# Patient Record
Sex: Female | Born: 1950 | Race: White | Hispanic: No | Marital: Single | Smoking: Current every day smoker
Health system: Southern US, Community
[De-identification: ages and names within clinical notes are randomized; demographics above are authoritative.]

## PROBLEM LIST (undated history)

## (undated) DIAGNOSIS — E039 Hypothyroidism, unspecified: Secondary | ICD-10-CM

## (undated) DIAGNOSIS — L80 Vitiligo: Secondary | ICD-10-CM

## (undated) DIAGNOSIS — F418 Other specified anxiety disorders: Secondary | ICD-10-CM

## (undated) DIAGNOSIS — E063 Autoimmune thyroiditis: Secondary | ICD-10-CM

## (undated) DIAGNOSIS — M2140 Flat foot [pes planus] (acquired), unspecified foot: Secondary | ICD-10-CM

## (undated) HISTORY — DX: Vitiligo: L80

## (undated) HISTORY — DX: Autoimmune thyroiditis: E06.3

## (undated) HISTORY — DX: Other specified anxiety disorders: F41.8

## (undated) HISTORY — DX: Flat foot (pes planus) (acquired), unspecified foot: M21.40

## (undated) HISTORY — PX: KNEE ARTHROSCOPY: SUR90

## (undated) HISTORY — DX: Hypothyroidism, unspecified: E03.9

## (undated) HISTORY — PX: BUNIONECTOMY: SHX129

---

## 1976-08-16 HISTORY — PX: TUBAL LIGATION: SHX77

## 1999-02-23 ENCOUNTER — Other Ambulatory Visit: Admission: RE | Admit: 1999-02-23 | Discharge: 1999-02-23 | Payer: Self-pay | Admitting: Obstetrics and Gynecology

## 1999-05-26 ENCOUNTER — Other Ambulatory Visit: Admission: RE | Admit: 1999-05-26 | Discharge: 1999-05-26 | Payer: Self-pay | Admitting: Obstetrics and Gynecology

## 1999-06-17 ENCOUNTER — Encounter (INDEPENDENT_AMBULATORY_CARE_PROVIDER_SITE_OTHER): Payer: Self-pay

## 1999-06-17 ENCOUNTER — Other Ambulatory Visit: Admission: RE | Admit: 1999-06-17 | Discharge: 1999-06-17 | Payer: Self-pay | Admitting: Obstetrics and Gynecology

## 1999-08-05 ENCOUNTER — Encounter: Admission: RE | Admit: 1999-08-05 | Discharge: 1999-08-05 | Payer: Self-pay | Admitting: Obstetrics and Gynecology

## 1999-08-05 ENCOUNTER — Encounter: Payer: Self-pay | Admitting: Obstetrics and Gynecology

## 1999-08-26 ENCOUNTER — Other Ambulatory Visit: Admission: RE | Admit: 1999-08-26 | Discharge: 1999-08-26 | Payer: Self-pay | Admitting: Obstetrics and Gynecology

## 1999-11-23 ENCOUNTER — Other Ambulatory Visit: Admission: RE | Admit: 1999-11-23 | Discharge: 1999-11-23 | Payer: Self-pay | Admitting: Obstetrics and Gynecology

## 2000-01-08 ENCOUNTER — Encounter (INDEPENDENT_AMBULATORY_CARE_PROVIDER_SITE_OTHER): Payer: Self-pay | Admitting: Specialist

## 2000-01-08 ENCOUNTER — Ambulatory Visit (HOSPITAL_COMMUNITY): Admission: RE | Admit: 2000-01-08 | Discharge: 2000-01-08 | Payer: Self-pay | Admitting: Obstetrics and Gynecology

## 2000-02-12 ENCOUNTER — Other Ambulatory Visit: Admission: RE | Admit: 2000-02-12 | Discharge: 2000-02-12 | Payer: Self-pay | Admitting: Obstetrics and Gynecology

## 2000-04-12 ENCOUNTER — Other Ambulatory Visit: Admission: RE | Admit: 2000-04-12 | Discharge: 2000-04-12 | Payer: Self-pay | Admitting: Obstetrics & Gynecology

## 2000-08-12 ENCOUNTER — Encounter: Payer: Self-pay | Admitting: Obstetrics & Gynecology

## 2000-08-12 ENCOUNTER — Encounter: Admission: RE | Admit: 2000-08-12 | Discharge: 2000-08-12 | Payer: Self-pay | Admitting: Obstetrics & Gynecology

## 2000-11-22 ENCOUNTER — Other Ambulatory Visit: Admission: RE | Admit: 2000-11-22 | Discharge: 2000-11-22 | Payer: Self-pay | Admitting: Obstetrics & Gynecology

## 2001-07-18 ENCOUNTER — Encounter: Admission: RE | Admit: 2001-07-18 | Discharge: 2001-07-18 | Payer: Self-pay | Admitting: Obstetrics & Gynecology

## 2001-07-18 ENCOUNTER — Encounter: Payer: Self-pay | Admitting: Obstetrics & Gynecology

## 2002-04-05 ENCOUNTER — Other Ambulatory Visit: Admission: RE | Admit: 2002-04-05 | Discharge: 2002-04-05 | Payer: Self-pay | Admitting: Obstetrics & Gynecology

## 2002-06-08 ENCOUNTER — Encounter (INDEPENDENT_AMBULATORY_CARE_PROVIDER_SITE_OTHER): Payer: Self-pay

## 2002-06-08 ENCOUNTER — Ambulatory Visit (HOSPITAL_COMMUNITY): Admission: RE | Admit: 2002-06-08 | Discharge: 2002-06-08 | Payer: Self-pay | Admitting: Obstetrics & Gynecology

## 2002-08-02 ENCOUNTER — Encounter: Payer: Self-pay | Admitting: Obstetrics & Gynecology

## 2002-08-02 ENCOUNTER — Encounter: Admission: RE | Admit: 2002-08-02 | Discharge: 2002-08-02 | Payer: Self-pay | Admitting: Obstetrics & Gynecology

## 2003-04-17 ENCOUNTER — Other Ambulatory Visit: Admission: RE | Admit: 2003-04-17 | Discharge: 2003-04-17 | Payer: Self-pay | Admitting: Obstetrics & Gynecology

## 2003-08-05 ENCOUNTER — Encounter: Admission: RE | Admit: 2003-08-05 | Discharge: 2003-08-05 | Payer: Self-pay | Admitting: Obstetrics & Gynecology

## 2004-07-21 ENCOUNTER — Other Ambulatory Visit: Admission: RE | Admit: 2004-07-21 | Discharge: 2004-07-21 | Payer: Self-pay | Admitting: Obstetrics & Gynecology

## 2005-10-20 ENCOUNTER — Encounter: Admission: RE | Admit: 2005-10-20 | Discharge: 2005-10-20 | Payer: Self-pay | Admitting: Obstetrics & Gynecology

## 2005-12-01 ENCOUNTER — Encounter: Admission: RE | Admit: 2005-12-01 | Discharge: 2005-12-01 | Payer: Self-pay | Admitting: Obstetrics & Gynecology

## 2005-12-17 ENCOUNTER — Encounter: Admission: RE | Admit: 2005-12-17 | Discharge: 2005-12-17 | Payer: Self-pay | Admitting: Endocrinology

## 2006-11-01 ENCOUNTER — Encounter: Admission: RE | Admit: 2006-11-01 | Discharge: 2006-11-01 | Payer: Self-pay | Admitting: Obstetrics & Gynecology

## 2009-12-22 ENCOUNTER — Encounter: Admission: RE | Admit: 2009-12-22 | Discharge: 2009-12-22 | Payer: Self-pay | Admitting: Obstetrics & Gynecology

## 2010-09-06 ENCOUNTER — Encounter: Payer: Self-pay | Admitting: Endocrinology

## 2010-09-06 ENCOUNTER — Encounter: Payer: Self-pay | Admitting: Internal Medicine

## 2010-09-06 ENCOUNTER — Encounter: Payer: Self-pay | Admitting: Obstetrics & Gynecology

## 2011-01-01 NOTE — Op Note (Signed)
[facility]  Patient:    Wanda Thompson, Wanda Thompson                      MRN: [id number] Proc. Date: 01/08/00 Adm. Date:  [id number] Disc. Date: [id number] Attending:  Malon Kindle                           Operative Report  PREOPERATIVE DIAGNOSES: 1. Abnormal genital cytology, persistent. 2. Unsatisfactory colposcopy. 3. 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  POSTOPERATIVE DIAGNOSES: 1. Abnormal genital cytology, persistent. 2. Unsatisfactory colposcopy. 3. 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  OPERATION:  CO2 laser of the cervix.  OPERATOR:  Malachi Pro. Ambrose Mantle, M.D.  ANESTHESIA:  General anesthesia.  DESCRIPTION OF PROCEDURE:  The patient was brought to the operating room and placed under satisfactory general anesthesia.  The vulva was prepped with Betadine solution.  The vulva was draped with sterile wet towels.  The uterus was anterior, normal size.  The adnexa were free of masses.  The cervix was exposed with a Graves speculum that was ebonized.  The cervix was prepped with 5% acetic acid.  The squamocolumnar junction was not seen.  I outlined the cone with a 10 watt laser, then injected the cervix with a solution mixed with 10 units of Pitressin and 120 cc of saline and I used about 15 cc of this material.  I then used the superpulse to do a conization.  The posterior lip went less far up the cervical canal than the anterior lip.  I cut the cone at 1 oclock after I removed it with the scissors.  I tried to get hemostasis high up on the endocervix with a defocused beam at 10 watts of laser.  I then did a fractional D&C after dilating the cervix just enough to get the small curette in.  I preserved the endocervical and endometrial tissue, then I resutured the cervix with four sutures of 0 chromic catgut.  Sutures were pretty much from 10 to 2, 2 to 5, 5 to 8 and 8 to 10.  Hemostasis appeared adequate and the procedure was  terminated.  Blood loss was less than 10 cc. Patient was returned to recovery in satisfactory condition. DD:  01/08/00 TD:  01/11/00 Job: [id number]

## 2011-01-01 NOTE — Op Note (Signed)
NAME:  Wanda Thompson, Wanda Thompson                         ACCOUNT NO.:  [id number]   MEDICAL RECORD NO.:  [id number]                   PATIENT TYPE:  AMB   LOCATION:  [facility]                                  FACILITY:  [facility]   PHYSICIAN:  Gerrit Friends. Aldona Bar, M.D.                DATE OF BIRTH:  [birth year]   DATE OF PROCEDURE:  06/08/2002  DATE OF DISCHARGE:                                 OPERATIVE REPORT   PREOPERATIVE DIAGNOSES:  Cervical dysplasia, human papilloma virus positive,  previous conization x2 elsewhere.   POSTOPERATIVE DIAGNOSES:  Cervical dysplasia, human papilloma virus  positive, previous conization x2 elsewhere, pathology pending.   PROCEDURE:  Loop electrosurgical excision procedure/conization of cervix.   SURGEON:  Gerrit Friends. Aldona Bar, M.D.   ANESTHESIA:  Intravenous conscious sedation and circumferential cervical  block with 1% Xylocaine with epinephrine.   PROCEDURE:  The patient was taken to the operating room and after the  satisfactory induction of intravenous conscious sedation she was prepped and  draped having been placed in the modified lithotomy position in the short  Allen stirrups in the usual manner for vaginal surgery.  The bladder was  drained of clear urine with a rubber catheter in an in-and-out fashion and  at this time a speculum was placed in the vagina, cervix visualized, and  thereafter infiltrated circumferentially with approximately 15 cc of 1%  Xylocaine with epinephrine.  The internal os was noted to be approximately 1-  1.5 cm within the cervical os.  Using the wide LEEP apparatus, the external  portion of the cervix was removed in the usual fashion with excellent  hemostasis and an excellent specimen.  This was pinned appropriately.  Using  the medium LEEP apparatus the next level was taken and thereafter using the  small LEEP apparatus a third level was taken, thus going all the way to the  internal os.  The cavity was then coagulated with the ball  apparatus and  Monsel solution applied thereafter.  Hemostasis was excellent.  The specimen  was totally pinned out in three portions, level one, level two, and level  three with pins at 12 o'clock.  The specimen was sent in saline to  pathology.   The patient at this time had all instruments removed, was awakened, and  taken to the recovery room in good condition.  She will be given an  instruction sheet and told to return to the office in approximately three  weeks' time.  Condition on arrival to recovery room:  Satisfactory.  Estimated blood loss negligible.  Pathologic specimen:  Conization of cervix  submitted in three levels.  Condition on arrival to recovery room:  Satisfactory.  Gerrit Friends. Aldona Bar, M.D.    RMW/MEDQ  D:  06/08/2002  T:  06/08/2002  Job:  [id number]

## 2011-04-09 ENCOUNTER — Other Ambulatory Visit: Payer: Self-pay | Admitting: Obstetrics & Gynecology

## 2011-04-09 DIAGNOSIS — Z1231 Encounter for screening mammogram for malignant neoplasm of breast: Secondary | ICD-10-CM

## 2011-04-12 ENCOUNTER — Ambulatory Visit
Admission: RE | Admit: 2011-04-12 | Discharge: 2011-04-12 | Disposition: A | Discharge status: Home / Self Care | Payer: 59 | Source: Ambulatory Visit | Attending: Obstetrics & Gynecology | Admitting: Obstetrics & Gynecology

## 2011-04-12 DIAGNOSIS — Z1231 Encounter for screening mammogram for malignant neoplasm of breast: Secondary | ICD-10-CM

## 2011-10-01 ENCOUNTER — Other Ambulatory Visit: Payer: Self-pay | Admitting: Obstetrics & Gynecology

## 2014-10-15 HISTORY — PX: COLONOSCOPY: SHX174

## 2014-12-31 ENCOUNTER — Other Ambulatory Visit: Payer: Self-pay | Admitting: Obstetrics & Gynecology

## 2015-01-01 LAB — CYTOLOGY - PAP

## 2015-03-13 ENCOUNTER — Encounter: Payer: Self-pay | Admitting: Internal Medicine

## 2015-03-13 ENCOUNTER — Institutional Professional Consult (permissible substitution): Payer: Self-pay | Admitting: Internal Medicine

## 2015-03-14 ENCOUNTER — Ambulatory Visit (INDEPENDENT_AMBULATORY_CARE_PROVIDER_SITE_OTHER): Payer: BLUE CROSS/BLUE SHIELD | Admitting: Internal Medicine

## 2015-03-14 ENCOUNTER — Encounter: Payer: Self-pay | Admitting: Internal Medicine

## 2015-03-14 VITALS — BP 116/66 | HR 82 | Ht 61.0 in | Wt 142.4 lb

## 2015-03-14 DIAGNOSIS — F17210 Nicotine dependence, cigarettes, uncomplicated: Secondary | ICD-10-CM

## 2015-03-14 DIAGNOSIS — R05 Cough: Secondary | ICD-10-CM

## 2015-03-14 DIAGNOSIS — Z720 Tobacco use: Secondary | ICD-10-CM | POA: Diagnosis not present

## 2015-03-14 DIAGNOSIS — R053 Chronic cough: Secondary | ICD-10-CM

## 2015-03-14 MED ORDER — ACETAMINOPHEN-CODEINE #3 300-30 MG PO TABS: ORAL_TABLET | ORAL | Status: DC

## 2015-03-14 MED ORDER — PANTOPRAZOLE SODIUM 40 MG PO TBEC: 40.0000 mg | DELAYED_RELEASE_TABLET | Freq: Every day | ORAL | Status: AC

## 2015-03-14 MED ORDER — MOMETASONE FURO-FORMOTEROL FUM 100-5 MCG/ACT IN AERO: INHALATION_SPRAY | RESPIRATORY_TRACT | Status: DC

## 2015-03-14 MED ORDER — PREDNISONE 10 MG PO TABS: ORAL_TABLET | ORAL | Status: DC

## 2015-03-14 MED ORDER — TRAMADOL HCL 50 MG PO TABS: ORAL_TABLET | ORAL | Status: DC

## 2015-03-14 MED ORDER — FAMOTIDINE 20 MG PO TABS: ORAL_TABLET | ORAL | Status: AC

## 2015-03-14 NOTE — Progress Notes (Signed)
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 PULMONARY CONSULT    Referre by Dr Ardeth Perfect for cough. Cough present for several years - worsened over the last 6 months- started MTX Feb 2016. CXR 03/10/15  first problem was bad arthritis since age 75 and Zeminksi dx  RA  rx embel x 1990s then humira > mtx started Feb 2016 then cough much worse  Original cough started around 2000 and the one time cough may have been better s cough suppression was while on advair x 3 month / may have responded to prednisone in past also but not sure.    Cough worse at hs and occ productive first thing in am only , and only a very small amt white mucus even then. Worse at work (though just started new job at [employer] which was long after onset of cough) and when in pool/ when it's rainy , assoc with nasal congestion  No obvious other patterns in day to day or daytime variabilty or assoc sob or cp or chest tightness, subjective wheeze  or hb symptoms. No unusual exp hx or h/o childhood pna/ asthma or knowledge of premature birth.  Sleeping ok without nocturnal  or early am exacerbation  of respiratory  c/o's or need for noct saba. Also denies any obvious fluctuation of symptoms with weather or environmental changes or other aggravating or alleviating factors except as outlined above   Current Medications, Allergies, Complete Past Medical History, Past Surgical History, Family History, and Social History were reviewed in [employer] record.          Review of Systems  Constitutional: Negative for fever and unexpected weight change.  HENT: Positive for congestion.  Negative for dental problem, ear pain, nosebleeds, postnasal drip, rhinorrhea, sinus pressure, sneezing, sore throat and trouble swallowing.   Eyes: Negative for redness and itching.  Respiratory: Positive for cough. Negative for chest tightness, shortness of breath and wheezing.   Cardiovascular: Negative for palpitations and leg swelling.  Gastrointestinal: Negative for nausea and vomiting.  Genitourinary: Negative for dysuria.  Musculoskeletal: Negative for joint swelling.  Skin: Negative for rash.  Neurological: Negative for headaches.  Hematological: Does not bruise/bleed easily.  Psychiatric/Behavioral: Positive for dysphoric mood. The patient is nervous/anxious.        Objective:   Physical Exam  amb wf nad very hoarse   Wt Readings from Last 3 Encounters:  03/14/15 142 lb 6.4 oz (64.592 kg)    Vital signs reviewed   HEENT: nl dentition,  Mod non specific bilateral edema turbinates, and orophanx. Nl external ear canals without cough reflex   NECK :  without JVD/Nodes/TM/ nl carotid upstrokes bilaterally   LUNGS: no acc muscle use, clear to A and P bilaterally without cough on insp or exp maneuvers   CV:  RRR  no s3 or murmur or increase in P2, no edema   ABD:  soft and nontender with nl excursion in the supine position. No bruits or organomegaly, bowel sounds nl  MS:  warm without deformities, calf tenderness, cyanosis or clubbing  SKIN: warm and dry  without lesions    NEURO:  alert, approp, no deficits     cxr 03/12/15  Ok per report      Assessment & Plan:

## 2015-03-14 NOTE — Patient Instructions (Addendum)
Dulera 100 Take 2 puffs first thing in am and then another 2 puffs about 12 hours later.    Pantoprazole (protonix) 40 mg   Take  30-60 min before first meal of the day and Pepcid (famotidine)  20 mg one @  bedtime until return to office - this is the best way to tell whether stomach acid is contributing to your problem.   GERD (REFLUX)  is an extremely common cause of respiratory symptoms just like yours , many times with no obvious heartburn at all.    It can be treated with medication, but also with lifestyle changes including elevation of the head of your bed (ideally with 6 inch  bed blocks),  Smoking cessation, avoidance of late meals, excessive alcohol, and avoid fatty foods, chocolate, peppermint, colas, red wine, and acidic juices such as orange juice.  NO MINT OR MENTHOL PRODUCTS SO NO COUGH DROPS  USE SUGARLESS CANDY INSTEAD (Jolley ranchers or Stover's or Life Savers) or even ice chips will also do - the key is to swallow to prevent all throat clearing. NO OIL BASED VITAMINS - use powdered substitutes.   For drainage take chlortrimeton (chlorpheniramine) 4 mg every 4 hours available over the counter (may cause drowsiness)   Prednisone 10 mg take  4 each am x 2 days,   2 each am x 2 days,  1 each am x 2 days and stop   Take delsym two tsp every 12 hours and supplement if needed with  Tylenol #3 up to 1- 2 every 4 hours to suppress the urge to cough. Swallowing water or using ice chips/non mint and menthol containing candies (such as lifesavers or sugarless jolly ranchers) are also effective.  You should rest your voice and avoid activities that you know make you cough.  Once you have eliminated the cough for 3 straight days try reducing the tylenol #3,  then the delsym as tolerated.     Please schedule a follow up office visit in 2 weeks, sooner if needed with all meds in hand including all inhalers and over the counter meds separated into the automatic vs the as neededs

## 2015-03-15 ENCOUNTER — Encounter: Payer: Self-pay | Admitting: Internal Medicine

## 2015-03-15 DIAGNOSIS — F17210 Nicotine dependence, cigarettes, uncomplicated: Secondary | ICD-10-CM | POA: Insufficient documentation

## 2015-03-15 DIAGNOSIS — J45991 Cough variant asthma: Secondary | ICD-10-CM | POA: Insufficient documentation

## 2015-03-15 NOTE — Assessment & Plan Note (Signed)
We rarely get smokers complaining of chronic cough .  Not that they don't cough/ they just don't complain about it because they know they will be told it's from smoking, which in her case is probably partially true for the am cough that is min productive typical of CB but it can certainly trigger cough variant asthma which may be the case here and she really needs to quit now.  > 3 min discussion  I emphasized that although we never turn away smokers from the pulmonary clinic, we do ask that they understand that the recommendations that we make  won't work nearly as well in the presence of continued cigarette exposure.  In fact, we may very well  reach a point where we can't promise to help the patient if he/she can't quit smoking. (We can and will promise to try to help, we just can't promise what we recommend will really work)

## 2015-03-15 NOTE — Assessment & Plan Note (Signed)
The most common causes of chronic cough in immunocompetent adults include the following: upper airway cough syndrome (UACS), previously referred to as postnasal drip syndrome (PNDS), which is caused by variety of rhinosinus conditions; (2) asthma; (3) GERD; (4) chronic bronchitis from cigarette smoking or other inhaled environmental irritants; (5) nonasthmatic eosinophilic bronchitis; and (6) bronchiectasis.   These conditions, singly or in combination, have accounted for up to 94% of the causes of chronic cough in prospective studies.   Other conditions have constituted no >6% of the causes in prospective studies These have included bronchogenic carcinoma, chronic interstitial pneumonia, sarcoidosis, left ventricular failure, ACEI-induced cough, and aspiration from a condition associated with pharyngeal dysfunction.    Chronic cough is often simultaneously caused by more than one condition. A single cause has been found from 38 to 82% of the time, multiple causes from 18 to 62%. Multiply caused cough has been the result of three diseases up to 42% of the time.       I suspect she has elements of   cough variant asthma  And UACS   with a cyclical component as well   rec trial of dulear 100 2bid  - The proper method of use, as well as anticipated side effects, of a metered-dose inhaler are discussed and demonstrated to the patient. Improved effectiveness after extensive coaching during this visit to a level of approximately  75%  And max gerd/ cyclical cough rx plus 1st gen H1 to eliminate all pnds then go from there   I had an extended discussion with the patient reviewing all relevant studies completed to date and  lasting  35 m  Explained The standardized cough guidelines published in Chest by Lissa Morales in 2006 are still the best available and consist of a multiple step process (up to 12!) , not a single office visit,  and are intended  to address this problem logically,  with an alogrithm  dependent on response to empiric treatment at  each progressive step  to determine a specific diagnosis with  minimal addtional testing needed. Therefore if adherence is an issue or can't be accurately verified,  it's very unlikely the standard evaluation and treatment will be successful here.    Furthermore, response to therapy (other than acute cough suppression, which should only be used short term with avoidance of narcotic containing cough syrups if possible), can be a gradual process for which the patient may perceive immediate benefit.  Unlike going to an eye doctor where the best perscription is almost always the first one and is immediately effective, this is almost never the case in the management of chronic cough syndromes. Therefore the patient needs to commit up front to consistently adhere to recommendations  for up to 6 weeks of therapy directed at the likely underlying problem(s) before the response can be reasonably evaluated.     Each maintenance medication was reviewed in detail including most importantly the difference between maintenance and prns and under what circumstances the prns are to be triggered using an action plan format that is not reflected in the computer generated alphabetically organized AVS.    Please see instructions for details which were reviewed in writing and the patient given a copy highlighting the part that I personally wrote and discussed at today's ov.

## 2015-03-17 ENCOUNTER — Telehealth: Payer: Self-pay | Admitting: Internal Medicine

## 2015-03-17 NOTE — Telephone Encounter (Signed)
Made in error

## 2015-03-28 ENCOUNTER — Ambulatory Visit (INDEPENDENT_AMBULATORY_CARE_PROVIDER_SITE_OTHER): Payer: Commercial Managed Care - HMO | Admitting: Internal Medicine

## 2015-03-28 ENCOUNTER — Encounter: Payer: Self-pay | Admitting: Internal Medicine

## 2015-03-28 VITALS — BP 132/84 | HR 83 | Ht 62.0 in | Wt 142.4 lb

## 2015-03-28 DIAGNOSIS — J45991 Cough variant asthma: Secondary | ICD-10-CM | POA: Diagnosis not present

## 2015-03-28 DIAGNOSIS — Z720 Tobacco use: Secondary | ICD-10-CM

## 2015-03-28 DIAGNOSIS — F17210 Nicotine dependence, cigarettes, uncomplicated: Secondary | ICD-10-CM

## 2015-03-28 MED ORDER — BUDESONIDE-FORMOTEROL FUMARATE 80-4.5 MCG/ACT IN AERO: INHALATION_SPRAY | RESPIRATORY_TRACT | Status: AC

## 2015-03-28 NOTE — Progress Notes (Signed)
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 PULMONARY CONSULT    Referre by Dr Ardeth Perfect for cough. Cough present for several years - worsened over the last 6 months- started MTX Feb 2016. CXR 03/10/15  first problem was bad arthritis since age 23 and Zeminksi dx  RA  rx embel x 1990s then humira > mtx started Feb 2016 then cough much worse  Original cough started around 2000 and the one time cough may have been better s cough suppression was while on advair x 3 month / may have responded to prednisone in past also but not sure.    Cough worse at hs and occ productive first thing in am only , and only a very small amt white mucus even then. Worse at work (though just started new job at [employer] which was long after onset of cough) and when in pool/ when it's rainy , assoc with nasal congestion rec Dulera 100 Take 2 puffs first thing in am and then another 2 puffs about 12 hours later.  Pantoprazole (protonix) 40 mg   Take  30-60 min before first meal of the day and Pepcid (famotidine)  20 mg one @  bedtime until return to office - this is the best way to tell whether stomach acid is contributing to your problem.  GERD diet   For drainage take chlortrimeton (chlorpheniramine) 4 mg every 4 hours available over the counter (may cause drowsiness)  Prednisone 10 mg take  4 each am x 2 days,   2 each am x 2 days,  1 each am x 2 days and stop  Take delsym two tsp every 12 hours and supplement if needed with  Tylenol #3 up to 1- 2 every 4 hours  Once you have eliminated the cough for 3 straight days try reducing the tylenol  #3,  then the delsym as tolerated.    Please schedule a follow up office visit in 2 weeks, sooner if needed with all meds in hand including all inhalers and over the counter meds separated into the automatic vs the as neededs   03/28/2015 f/u ov/Eliasar Hlavaty re: cough x 2000 worse since winter 2016 Chief Complaint  Patient presents with  . Follow-up    Pt states that her cough has pretty much resolved.   resolved on dulera/ no need for saba or cough meds Still has sense of pnds and could not tol 1st gen h1 due to drowsiness  Not limited by breathing from desired activities    No obvious day to day or daytime variability or assoc chronic cough or cp or chest tightness, subjective wheeze or overt sinus or hb symptoms. No unusual exp hx or h/o childhood pna/ asthma or knowledge of premature birth.  Sleeping ok without nocturnal  or early am exacerbation  of respiratory  c/o's or need for noct saba. Also denies any obvious fluctuation of symptoms with weather or environmental changes or other aggravating or alleviating factors except as outlined above   Current Medications, Allergies, Complete Past Medical History, Past Surgical  History, Family History, and Social History were reviewed in [employer] record.  ROS  The following are not active complaints unless bolded sore throat, dysphagia, dental problems, itching, sneezing,  nasal congestion or excess/ purulent secretions, ear ache,   fever, chills, sweats, unintended wt loss, classically pleuritic or exertional cp, hemoptysis,  orthopnea pnd or leg swelling, presyncope, palpitations, abdominal pain, anorexia, nausea, vomiting, diarrhea  or change in bowel or bladder habits, change in stools or urine, dysuria,hematuria,  rash, arthralgias, visual complaints, headache, numbness, weakness or ataxia or problems with walking or coordination,  change in mood/affect or memory.           .       Objective:   Physical Exam  amb wf  nad    Wt Readings from Last 3 Encounters:  03/28/15 142 lb 6.4 oz (64.592 kg)  03/14/15 142 lb 6.4 oz (64.592 kg)    Vital signs reviewed   HEENT: nl dentition,  Mild non specific bilateral edema turbinates, and orophanx. Nl external ear canals without cough reflex   NECK :  without JVD/Nodes/TM/ nl carotid upstrokes bilaterally   LUNGS: no acc muscle use, clear to A and P bilaterally without cough on insp or exp maneuvers   CV:  RRR  no s3 or murmur or increase in P2, no edema   ABD:  soft and nontender with nl excursion in the supine position. No bruits or organomegaly, bowel sounds nl  MS:  warm without deformities, calf tenderness, cyanosis or clubbing  SKIN: warm and dry without lesions    NEURO:  alert, approp, no deficits     cxr 03/12/15  Ok per report      Assessment & Plan:   Outpatient Encounter Prescriptions as of 03/28/2015  Medication Sig  . Adalimumab (HUMIRA PEN) 40 MG/0.8ML PNKT Inject into the skin every 14 (fourteen) days.  . citalopram (CELEXA) 10 MG tablet Take 10 mg by mouth daily.  . famotidine (PEPCID) 20 MG tablet One at bedtime  . folic acid (FOLVITE) 1 MG tablet Take 1 mg by mouth daily.  . Multiple Vitamins-Minerals (MULTIVITAMIN WITH MINERALS) tablet Take 1 tablet by mouth daily.  . pantoprazole (PROTONIX) 40 MG tablet Take 1 tablet (40 mg total) by mouth daily. Take 30-60 min before first meal of the day  . SYNTHROID 100 MCG tablet Take 100 mcg by mouth daily.  . [DISCONTINUED] mometasone-formoterol (DULERA) 100-5 MCG/ACT AERO Take 2 puffs first thing in am and then another 2 puffs about 12 hours later.  Marland Kitchen acetaminophen-codeine (TYLENOL #3) 300-30 MG per tablet One every 4 hours as needed for cough (Patient not taking: Reported on 03/28/2015)  . budesonide-formoterol (SYMBICORT) 80-4.5 MCG/ACT inhaler Take 2 puffs first thing in am and then another 2 puffs about 12 hours later.  . [DISCONTINUED] benzonatate (TESSALON) 100 MG capsule as  needed.  . [DISCONTINUED] Cholecalciferol 1000 UNITS capsule Take 1,000 Units by mouth daily.  . [DISCONTINUED] methotrexate (RHEUMATREX) 2.5 MG tablet Take 3 tablets by mouth once a week.  . [DISCONTINUED] predniSONE (DELTASONE) 10 MG tablet Take  4 each am x 2 days,   2 each am x 2 days,  1 each am x 2 days and stop  . [DISCONTINUED] sertraline (ZOLOFT) 100 MG tablet Take 100 mg by mouth daily.   No facility-administered encounter medications on file as of 03/28/2015.

## 2015-03-28 NOTE — Patient Instructions (Signed)
Finish dulera and then start symbicort 80 Take 2 puffs first thing in am and then another 2 puffs about 12 hours later and call us if your pharmacy doesn't honor the coupon  For itching /sneezing/ runny nose > certrazine 10 mg daily as needed (zyrtec)   The key is to stop smoking completely before smoking completely stops you!   Please schedule a follow up office visit in 6 weeks, call sooner if needed

## 2015-03-29 ENCOUNTER — Encounter: Payer: Self-pay | Admitting: Internal Medicine

## 2015-03-29 NOTE — Assessment & Plan Note (Signed)
>   3 min discussion I reviewed the Fletcher curve with the patient that basically indicates  if you quit smoking when your best day FEV1 is still well preserved (as appears to be  the case here)  it is highly unlikely you will progress to severe disease and informed the patient there was no medication on the market that has proven to alter the curve/ its downward trajectory  or the likelihood of progression of their disease.  Therefore stopping smoking and maintaining abstinence is the most important aspect of care, not choice of inhalers or for that matter, doctors.

## 2015-03-29 NOTE — Assessment & Plan Note (Addendum)
Very impressive response to empiric rx for cough variant despite ongoing smoking (see sep a/p)  All goals of chronic asthma control met including optimal function and elimination of symptoms with noneed for rescue therapy.  Contingencies discussed in full including contacting this office immediately if not controlling the symptoms using the rule of two's.     I had an extended discussion with the patient reviewing all relevant studies completed to date and  lasting 15 to 20 minutes of a 25 minute visit    Each maintenance medication was reviewed in detail including most importantly the difference between maintenance and prns and under what circumstances the prns are to be triggered using an action plan format that is not reflected in the computer generated alphabetically organized AVS.    Please see instructions for details which were reviewed in writing and the patient given a copy highlighting the part that I personally wrote and discussed at today's ov.   If continues to do so well can wean off the gerd rx p 6 weeks/ advised

## 2015-04-02 ENCOUNTER — Telehealth: Payer: Self-pay | Admitting: *Deleted

## 2015-04-02 NOTE — Telephone Encounter (Signed)
-----   Message from Tanda Rockers, MD sent at 03/29/2015  8:12 PM EDT ----- Be sure we gave her the zero coupon for symbicort

## 2015-04-02 NOTE — Telephone Encounter (Signed)
lmtcb x1 

## 2015-04-03 NOTE — Telephone Encounter (Signed)
lmtcb x1 

## 2015-04-03 NOTE — Telephone Encounter (Signed)
She did receive zero coupon for Symbicort and was able to use it.  She is asking when will she know the Wanda Thompson is out?  Can leave a message for her at [phone].

## 2015-04-03 NOTE — Telephone Encounter (Signed)
lmomtcb x 2  

## 2015-04-11 NOTE — Telephone Encounter (Signed)
Spoke with the pt  She is aware Ruthe Mannan has a counter on it and has no further questions about meds  Nothing further needed

## 2015-05-12 ENCOUNTER — Ambulatory Visit: Payer: Self-pay | Admitting: Internal Medicine

## 2015-05-26 ENCOUNTER — Ambulatory Visit (INDEPENDENT_AMBULATORY_CARE_PROVIDER_SITE_OTHER): Payer: Commercial Managed Care - HMO | Admitting: Internal Medicine

## 2015-05-26 ENCOUNTER — Encounter: Payer: Self-pay | Admitting: Internal Medicine

## 2015-05-26 VITALS — BP 124/74 | HR 86 | Ht 61.0 in | Wt 141.6 lb

## 2015-05-26 DIAGNOSIS — J45991 Cough variant asthma: Secondary | ICD-10-CM | POA: Diagnosis not present

## 2015-05-26 DIAGNOSIS — Z720 Tobacco use: Secondary | ICD-10-CM | POA: Diagnosis not present

## 2015-05-26 DIAGNOSIS — F17210 Nicotine dependence, cigarettes, uncomplicated: Secondary | ICD-10-CM

## 2015-05-26 NOTE — Progress Notes (Signed)
Subjective:    Patient ID: Wanda Thompson, female    DOB: [birth year]      MRN: [id number]   Brief patient profile:  64 yowf former  insurance biller for [employer] active smoker with cough onset x around 2000 much worse since winter 20016 referred to pulmonary clinic 03/14/15  by Dr Ardeth Perfect   History of Present Illness  03/14/2015 1st [facility] office visit/ [facility]   Chief Complaint  Patient presents with  . PULMONARY CONSULT    Referre by Dr Ardeth Perfect for cough. Cough present for several years - worsened over the last 6 months- started MTX Feb 2016. CXR 03/10/15  first problem was bad arthritis since age 64 and Zeminksi dx  RA  rx embel x 1990s then humira > mtx started Feb 2016 then cough much worse  Original cough started around 2000 and the one time cough may have been better s cough suppression was while on advair x 3 month / may have responded to prednisone in past also but not sure.    Cough worse at hs and occ productive first thing in am only , and only a very small amt white mucus even then. Worse at work (though just started new job at [employer] which was long after onset of cough) and when in pool/ when it's rainy , assoc with nasal congestion rec Dulera 100 Take 2 puffs first thing in am and then another 2 puffs about 12 hours later.  Pantoprazole (protonix) 40 mg   Take  30-60 min before first meal of the day and Pepcid (famotidine)  20 mg one @  bedtime until return to office - this is the best way to tell whether stomach acid is contributing to your problem.  GERD diet   For drainage take chlortrimeton (chlorpheniramine) 4 mg every 4 hours available over the counter (may cause drowsiness)  Prednisone 10 mg take  4 each am x 2 days,   2 each am x 2 days,  1 each am x 2 days and stop  Take delsym two tsp every 12 hours and supplement if needed with  Tylenol #3 up to 1- 2 every 4 hours  Once you have eliminated the cough for 3 straight days try reducing the tylenol  #3,  then the delsym as tolerated.    Please schedule a follow up office visit in 2 weeks, sooner if needed with all meds in hand including all inhalers and over the counter meds separated into the automatic vs the as neededs   03/28/2015 f/u ov/Wert re: cough x 2000 worse since winter 2016 Chief Complaint  Patient presents with  . Follow-up    Pt states that her cough has pretty much resolved.   resolved on dulera/ no need for saba or cough meds Still has sense of pnds and could not tol 1st gen h1 due to drowsiness rec [employer] and then start symbicort 80 Take 2 puffs first thing in am and then another 2 puffs about 12 hours later and call us if your pharmacy doesn't honor the coupon For itching /sneezing/ runny nose > certrazine 10 mg daily as needed (zyrtec)  The key is to stop smoking completely before smoking completely stops you!     05/26/2015  f/u ov/Wert re: chronic cough worse since winter 2016/ still smoking Chief Complaint  Patient presents with  . Follow-up    Cough bothers her only when it is raining.  Discuss Protonix, making her feel  sick, rumbling stomach. knot on left leg      ppi gi upset so not taking regularly    Not limited by breathing from desired activities / not needs sama or cough suppression regularly   No obvious day to day or daytime variability or assoc chronic cough or cp or chest tightness, subjective wheeze or overt sinus or hb symptoms. No unusual exp hx or h/o childhood pna/ asthma or knowledge of premature birth.  Sleeping ok without nocturnal  or early am exacerbation  of respiratory  c/o's or need for noct saba. Also denies any obvious fluctuation of symptoms with weather or environmental changes or other aggravating or alleviating factors except as outlined above   Current Medications, Allergies, Complete Past Medical History, Past Surgical History, Family History, and Social History were reviewed in [employer]  record.  ROS  The following are not active complaints unless bolded sore throat, dysphagia, dental problems, itching, sneezing,  nasal congestion or excess/ purulent secretions, ear ache,   fever, chills, sweats, unintended wt loss, classically pleuritic or exertional cp, hemoptysis,  orthopnea pnd or leg swelling, presyncope, palpitations, abdominal pain, anorexia, nausea, vomiting, diarrhea  or change in bowel or bladder habits, change in stools or urine, dysuria,hematuria,  rash, arthralgias, visual complaints, headache, numbness, weakness or ataxia or problems with walking or coordination,  change in mood/affect or memory.           .       Objective:   Physical Exam  amb wf nad     .05/26/2015     141  Wt Readings from Last 3 Encounters:  03/28/15 142 lb 6.4 oz (64.592 kg)  03/14/15 142 lb 6.4 oz (64.592 kg)    Vital signs reviewed   HEENT: nl dentition,  Mild non specific bilateral edema turbinates, and orophanx. Nl external ear canals without cough reflex   NECK :  without JVD/Nodes/TM/ nl carotid upstrokes bilaterally   LUNGS: no acc muscle use, clear to A and P bilaterally without cough on insp or exp maneuvers   CV:  RRR  no s3 or murmur or increase in P2, no edema   ABD:  soft and nontender with nl excursion in the supine position. No bruits or organomegaly, bowel sounds nl  MS:  warm without deformities, calf tenderness, cyanosis or clubbing  SKIN: warm and dry without lesions    NEURO:  alert, approp, no deficits     cxr 03/12/15  Ok per report      Assessment & Plan:

## 2015-05-26 NOTE — Patient Instructions (Addendum)
Stop pantoprazole and take pepcid 20 mg after bfast x 2 week then try weaning it off completely and see if you can wean it off   Continue symbicort 80 Take 2 puffs first thing in am and then another 2 puffs about 12 hours later.   Work on inhaler technique:  relax and gently blow all the way out then take a nice smooth deep breath back in, triggering the inhaler at same time you start breathing in.  Hold for up to 5 seconds if you can. Blow out thru nose. Rinse and gargle with water when done - best to brush teeth after using arm and hammer baking soda based    Please schedule a follow up visit in 3 months but call sooner if needed with pfts on return

## 2015-05-27 NOTE — Assessment & Plan Note (Signed)
>   3 m I took an extended  opportunity with this patient to outline the consequences of continued cigarette use  in airway disorders based on all the data we have from the multiple national lung health studies (perfomed over decades at millions of dollars in cost)  indicating that smoking cessation, not choice of inhalers or physicians, is the most important aspect of care.    Needs pft's to see where she is on the fletcher curve - will do on return

## 2015-05-27 NOTE — Assessment & Plan Note (Addendum)
-   trial of dulera 100 2bid 03/14/2015 > changed to symbicort 80 2bid due to cost  Clearly she is much  better at this point despite continued smoking (see sep a/p)   She has clear lungs on exam and no overt evidence of reflux contributing to her cough at this point. I therefore recommended she continue the diet we gave her previously but try weaning off all acid suppression to see if the cough flares "even when it's not raining", and if so that would indicate there is still a component of reflux  I had an extended discussion with the patient reviewing all relevant studies completed to date and  lasting 15 to 20 minutes of a 25 minute visit    Each maintenance medication was reviewed in detail including most importantly the difference between maintenance and prns and under what circumstances the prns are to be triggered using an action plan format that is not reflected in the computer generated alphabetically organized AVS.    Please see instructions for details which were reviewed in writing and the patient given a copy highlighting the part that I personally wrote and discussed at today's ov.

## 2015-06-13 ENCOUNTER — Telehealth: Payer: Self-pay | Admitting: Internal Medicine

## 2015-06-13 NOTE — Telephone Encounter (Signed)
Spoke with pt.  She is aware we will call back to follow up on Monday.  Pt ok with this.

## 2015-06-13 NOTE — Telephone Encounter (Signed)
Spoke with pt, states she requesting to speak with manager/director regarding an "issue" she is following up on from earlier in the week.  Did not wish do further discuss this with me. Forwarding to [employer] please advise.  Thanks!

## 2015-06-17 NOTE — Telephone Encounter (Signed)
Message has been handled.  Will sign off.

## 2015-06-17 NOTE — Telephone Encounter (Signed)
Please advise Crystal, thanks

## 2015-07-09 ENCOUNTER — Ambulatory Visit (INDEPENDENT_AMBULATORY_CARE_PROVIDER_SITE_OTHER): Payer: Commercial Managed Care - HMO | Admitting: Podiatry

## 2015-07-09 ENCOUNTER — Encounter: Payer: Self-pay | Admitting: Podiatry

## 2015-07-09 VITALS — BP 155/83 | HR 76 | Resp 16

## 2015-07-09 DIAGNOSIS — Q828 Other specified congenital malformations of skin: Secondary | ICD-10-CM | POA: Diagnosis not present

## 2015-07-09 DIAGNOSIS — M2041 Other hammer toe(s) (acquired), right foot: Secondary | ICD-10-CM

## 2015-07-09 NOTE — Progress Notes (Signed)
   Subjective:    Patient ID: Wanda Thompson, female    DOB: 01-30-1951, 64 y.o.   MRN: [id number]  HPI Comments: "I have a callus"  Patient presents with: Callouses: Trim calluses plantar left foot - tender for several months, usually just comes to have thme trimmed and feels better. Would like them trimmed today.       Review of Systems  Respiratory: Positive for cough.   All other systems reviewed and are negative.      Objective:   Physical Exam        Assessment & Plan:

## 2015-07-10 NOTE — Progress Notes (Signed)
Subjective:     Patient ID: Wanda Thompson, female   DOB: 04-18-1951, 64 y.o.   MRN: [id number]  HPI patient presents with significant lesion formation plantar aspect of the left foot that's been very painful and she cannot manage herself. She has had a history of this and it's flared up recently and is quite sore and also complains of digital deformity on the right foot that someday needs to be corrected   Review of Systems  All other systems reviewed and are negative.      Objective:   Physical Exam  Constitutional: She is oriented to person, place, and time.  Cardiovascular: Intact distal pulses.   Musculoskeletal: Normal range of motion.  Neurological: She is oriented to person, place, and time.  Skin: Skin is warm.  Nursing note and vitals reviewed.  neurovascular status intact muscle strength adequate range of motion within normal limits with patient noted to have quite a bit of discomfort with keratotic lesion with lucent core plantar aspect left foot. 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

## 2015-07-30 ENCOUNTER — Ambulatory Visit: Payer: Self-pay | Admitting: Podiatry

## 2015-08-28 ENCOUNTER — Ambulatory Visit: Payer: Commercial Managed Care - HMO | Admitting: Internal Medicine

## 2015-09-17 ENCOUNTER — Ambulatory Visit: Payer: Commercial Managed Care - HMO | Admitting: Internal Medicine

## 2015-10-31 ENCOUNTER — Ambulatory Visit: Payer: Commercial Managed Care - HMO | Admitting: Internal Medicine

## 2015-11-03 ENCOUNTER — Ambulatory Visit (INDEPENDENT_AMBULATORY_CARE_PROVIDER_SITE_OTHER): Payer: 59 | Admitting: Podiatry

## 2015-11-03 ENCOUNTER — Encounter: Payer: Self-pay | Admitting: Podiatry

## 2015-11-03 VITALS — BP 128/71 | HR 75 | Resp 16

## 2015-11-03 DIAGNOSIS — Q828 Other specified congenital malformations of skin: Secondary | ICD-10-CM | POA: Diagnosis not present

## 2015-11-03 DIAGNOSIS — M2041 Other hammer toe(s) (acquired), right foot: Secondary | ICD-10-CM

## 2015-11-03 DIAGNOSIS — M216X9 Other acquired deformities of unspecified foot: Secondary | ICD-10-CM

## 2015-11-04 NOTE — Progress Notes (Signed)
Subjective:     Patient ID: Wanda Thompson, female   DOB: 1950-10-12, 65 y.o.   MRN: [id number]  HPI patient presents with very painful lesion plantar aspect left foot that she has utilized debridement which gave her temporary relief of symptoms   Review of Systems     Objective:   Physical Exam Neurovascular status intact with continued keratotic lesion sub-third metatarsal left that's very painful when pressed with lesion directly on the metatarsal head    Assessment:     Combination of structural deformity with chronic skin lesion plantar left    Plan:     Educated patient on consideration of orthotics or possible surgical intervention in this case. Today deep debridement accomplished with padding and will be seen back as needed

## 2016-04-12 ENCOUNTER — Other Ambulatory Visit: Payer: Self-pay | Admitting: Internal Medicine

## 2016-06-01 ENCOUNTER — Other Ambulatory Visit: Payer: Self-pay | Admitting: Obstetrics & Gynecology

## 2016-10-28 ENCOUNTER — Telehealth: Payer: Self-pay | Admitting: Hematology

## 2016-10-28 ENCOUNTER — Encounter: Payer: Self-pay | Admitting: Hematology

## 2016-10-28 NOTE — Telephone Encounter (Signed)
Appt has been scheduled with Dr. Irene Limbo on 4/17 at 1pm. Scheduled with the referring office who will contact the pt about the appt. Letter mailed.

## 2016-11-30 ENCOUNTER — Ambulatory Visit (HOSPITAL_BASED_OUTPATIENT_CLINIC_OR_DEPARTMENT_OTHER): Payer: 59

## 2016-11-30 ENCOUNTER — Ambulatory Visit (HOSPITAL_BASED_OUTPATIENT_CLINIC_OR_DEPARTMENT_OTHER): Payer: 59 | Admitting: Hematology

## 2016-11-30 ENCOUNTER — Encounter: Payer: Self-pay | Admitting: Hematology

## 2016-11-30 ENCOUNTER — Telehealth: Payer: Self-pay | Admitting: Hematology

## 2016-11-30 VITALS — BP 134/67 | HR 92 | Temp 98.2°F | Resp 17 | Ht 61.0 in | Wt 137.1 lb

## 2016-11-30 DIAGNOSIS — R21 Rash and other nonspecific skin eruption: Secondary | ICD-10-CM

## 2016-11-30 DIAGNOSIS — D72829 Elevated white blood cell count, unspecified: Secondary | ICD-10-CM | POA: Diagnosis not present

## 2016-11-30 DIAGNOSIS — Z720 Tobacco use: Secondary | ICD-10-CM | POA: Diagnosis not present

## 2016-11-30 DIAGNOSIS — M069 Rheumatoid arthritis, unspecified: Secondary | ICD-10-CM | POA: Diagnosis not present

## 2016-11-30 LAB — CBC & DIFF AND RETIC
BASO%: 0.5 % (ref 0.0–2.0)
BASOS ABS: 0.1 10*3/uL (ref 0.0–0.1)
EOS%: 1.5 % (ref 0.0–7.0)
Eosinophils Absolute: 0.2 10*3/uL (ref 0.0–0.5)
HEMATOCRIT: 41.1 % (ref 34.8–46.6)
HGB: 14 g/dL (ref 11.6–15.9)
Immature Retic Fract: 3.5 % (ref 1.60–10.00)
LYMPH#: 3.2 10*3/uL (ref 0.9–3.3)
LYMPH%: 23 % (ref 14.0–49.7)
MCH: 29.9 pg (ref 25.1–34.0)
MCHC: 34.1 g/dL (ref 31.5–36.0)
MCV: 87.6 fL (ref 79.5–101.0)
MONO#: 1.1 10*3/uL — ABNORMAL HIGH (ref 0.1–0.9)
MONO%: 8 % (ref 0.0–14.0)
NEUT%: 67 % (ref 38.4–76.8)
NEUTROS ABS: 9.3 10*3/uL — AB (ref 1.5–6.5)
Platelets: 193 10*3/uL (ref 145–400)
RBC: 4.69 10*6/uL (ref 3.70–5.45)
RDW: 14.2 % (ref 11.2–14.5)
RETIC %: 1.45 % (ref 0.70–2.10)
RETIC CT ABS: 68.01 10*3/uL (ref 33.70–90.70)
WBC: 13.9 10*3/uL — AB (ref 3.9–10.3)

## 2016-11-30 LAB — COMPREHENSIVE METABOLIC PANEL
ALT: 18 U/L (ref 0–55)
AST: 19 U/L (ref 5–34)
Albumin: 4.1 g/dL (ref 3.5–5.0)
Alkaline Phosphatase: 96 U/L (ref 40–150)
Anion Gap: 12 mEq/L — ABNORMAL HIGH (ref 3–11)
BUN: 12.7 mg/dL (ref 7.0–26.0)
CALCIUM: 9.5 mg/dL (ref 8.4–10.4)
CHLORIDE: 106 meq/L (ref 98–109)
CO2: 24 meq/L (ref 22–29)
CREATININE: 0.7 mg/dL (ref 0.6–1.1)
EGFR: >90 mL/min/{1.73_m2} (ref >90)
GLUCOSE: 88 mg/dL (ref 70–140)
POTASSIUM: 3.9 meq/L (ref 3.5–5.1)
SODIUM: 142 meq/L (ref 136–145)
Total Bilirubin: 0.55 mg/dL (ref 0.20–1.20)
Total Protein: 7.9 g/dL (ref 6.4–8.3)

## 2016-11-30 LAB — LACTATE DEHYDROGENASE: LDH: 216 U/L (ref 125–245)

## 2016-11-30 LAB — CHCC SMEAR

## 2016-11-30 NOTE — Telephone Encounter (Signed)
Lab appointment added for today, per 11/30/16 los. Follow up as needed, per Dr Irene Limbo.

## 2016-11-30 NOTE — Progress Notes (Signed)
[facility]    HEMATOLOGY/ONCOLOGY CONSULTATION NOTE  Date of Service: 11/30/2016  Patient Care Team: Velna Hatchet, MD as PCP - General (Internal Medicine) Hermelinda Medicus MD (Rheumatologist) -[facility] Also follows with [facility] and dermatology   CHIEF COMPLAINTS/PURPOSE OF CONSULTATION:  Leucocytosis  HISTORY OF PRESENTING ILLNESS:  Wanda Thompson is a wonderful 66 y.o. female who has been referred to [place] by Dr .Velna Hatchet, MD  for evaluation and management of leucocytosis.  Patient has a history of rheumatoid arthritis and notes that she has been on Humira for 21 years and was previously on enbrel, Hashimoto's thyroiditis, vitiligo, active smoker one pack per day, depression/anxiety : Routine labs with her primary care physician on 10/12/2016 was noted to have mild leukocytosis of 12.1k with a normal hemoglobin of 14.9 and a normal platelet count of 280k. Patient had repeat labs on 10/20/2016 which showed a WBC count of 13.56k with neutrophils of 9.4k, monocytes 800. Lymphocytes 2.9k. With normal hemoglobin and platelets.  Patient was referred to [place] for further evaluation of her neutrophilic leukocytosis. She notes no acute fevers or chills.  Notes that she has been having red nodule or bumps on her skin over the upper and lower extremities, the armpits and some on the trunk as well as a rash in her belly button. She notes that her rheumatologist treated her with fluconazole and her rash got better but still comes back from time to time.  She continues to smoke 1 pack of cigarettes per day.  Notes multiple symptoms including a chronic cough and notes that she has had some infiltrates in the lung. CT images are not available to [place]. She also notes that she has a lung nodule that is being monitored by her pulmonologist. She knows that there was some concern that she might be having interstitial lung disease related to her rheumatoid arthritis but is unsure about  this.  She is also concerned that she is developing significant nail changes at the base of her nail. She notes that she has been seen by dermatology and nail testing did not show overt fungal infection but that she did not follow up appropriately to complete the workup and wonders if she should go back to dermatology.   We discussed that the nail changes could certainly be from a persistent fungal infection in the setting of immunosuppression or could be other atypical infections such as nocardia or cryptococcus but can also be autoimmune nail bed destruction due to her underlying autoimmune conditions.  No abdominal pain. No issues with bleeding.   MEDICAL HISTORY:  Past Medical History:  Diagnosis Date  . Collapsed arches    left  . Depression with anxiety   . Hashimoto's disease   . Hypothyroid   . Vitiligo   Rheumatoid arthritis on Humira for 66 y/o per patient (was previously on enbrel).   SURGICAL HISTORY: Past Surgical History:  Procedure Laterality Date  . BUNIONECTOMY  20 years ago  . COLONOSCOPY  10/2014  . KNEE ARTHROSCOPY  15 years ago  . TUBAL LIGATION  1978    SOCIAL HISTORY: Social History   Social History  . Marital status: Single    Spouse name: N/A  . Number of children: N/A  . Years of education: N/A   Occupational History  . insurance billing    Social History Main Topics  . Smoking status: Current Every Day Smoker    Packs/day: 0.50    Types: Cigarettes  . Smokeless tobacco: Never Used  .  Alcohol use 0.0 oz/week     Comment: 3-4 glasses wine weekly  . Drug use: No  . Sexual activity: Not on file   Other Topics Concern  . Not on file   Social History Narrative  . 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  FAMILY HISTORY: History reviewed. No pertinent family history.  ALLERGIES:  has No Known Allergies.  MEDICATIONS:  Current Outpatient Prescriptions  Medication Sig Dispense Refill  . Adalimumab (HUMIRA PEN) 40  MG/0.8ML PNKT Inject into the skin every 14 (fourteen) days.    . budesonide-formoterol (SYMBICORT) 80-4.5 MCG/ACT inhaler Take 2 puffs first thing in am and then another 2 puffs about 12 hours later. 1 Inhaler 11  . citalopram (CELEXA) 10 MG tablet Take 10 mg by mouth daily.  1  . famotidine (PEPCID) 20 MG tablet One at bedtime 30 tablet 11  . folic acid (FOLVITE) 1 MG tablet Take 1 mg by mouth daily.    . Multiple Vitamins-Minerals (MULTIVITAMIN WITH MINERALS) tablet Take 1 tablet by mouth daily.    . pantoprazole (PROTONIX) 40 MG tablet Take 1 tablet (40 mg total) by mouth daily. Take 30-60 min before first meal of the day 30 tablet 2  . SYNTHROID 100 MCG tablet Take 100 mcg by mouth daily.     No current facility-administered medications for this visit.     REVIEW OF SYSTEMS:    10 Point review of Systems was done is negative except as noted above.  PHYSICAL EXAMINATION: ECOG PERFORMANCE STATUS: 2 - Symptomatic, <50% confined to bed  . Vitals:   11/30/16 1318  BP: 134/67  Pulse: 92  Resp: 17  Temp: 98.2 F (36.8 C)   Filed Weights   11/30/16 1318  Weight: 137 lb 1.6 oz (62.2 kg)   .Body mass index is 25.9 kg/m.  GENERAL:alert, in no acute distress and comfortable SKIN - few red Skin spots on her forearms. Significant appearance of base of the nail dystrophic changes on most fingers. Red scaly rash in the belly button. EYES: conjunctiva are pink and non-injected, sclera anicteric OROPHARYNX: MMM, no exudates, no oropharyngeal erythema or ulceration NECK: supple, no JVD LYMPH:  no palpable lymphadenopathy in the cervical, axillary or inguinal regions LUNGS: clear to auscultation b/l with normal respiratory effort HEART: regular rate & rhythm ABDOMEN:  normoactive bowel sounds , non tender, not distended. No palpable hepato-splenomegaly Extremity: no pedal edema PSYCH: alert & oriented x 3 with fluent speech NEURO: no focal motor/sensory deficits  LABORATORY DATA:  I  have reviewed the data as listed  . CBC Latest Ref Rng & Units 11/30/2016  WBC 3.9 - 10.3 10e3/uL 13.9(H)  Hemoglobin 11.6 - 15.9 g/dL 14.0  Hematocrit 34.8 - 46.6 % 41.1  Platelets 145 - 400 10e3/uL 193   . CBC    Component Value Date/Time   WBC 13.9 (H) 11/30/2016 1434   RBC 4.69 11/30/2016 1434   HGB 14.0 11/30/2016 1434   HCT 41.1 11/30/2016 1434   PLT 193 11/30/2016 1434   MCV 87.6 11/30/2016 1434   MCH 29.9 11/30/2016 1434   MCHC 34.1 11/30/2016 1434   RDW 14.2 11/30/2016 1434   LYMPHSABS 3.2 11/30/2016 1434   MONOABS 1.1 (H) 11/30/2016 1434   EOSABS 0.2 11/30/2016 1434   BASOSABS 0.1 11/30/2016 1434    . CMP Latest Ref Rng & Units 11/30/2016  Glucose 70 - 140 mg/dl 88  BUN 7.0 - 26.0 mg/dL 12.7  Creatinine 0.6 -  1.1 mg/dL 0.7  Sodium 136 - 145 mEq/L 142  Potassium 3.5 - 5.1 mEq/L 3.9  CO2 22 - 29 mEq/L 24  Calcium 8.4 - 10.4 mg/dL 9.5  Total Protein 6.4 - 8.3 g/dL 7.9  Total Bilirubin 0.20 - 1.20 mg/dL 0.55  Alkaline Phos 40 - 150 U/L 96  AST 5 - 34 U/L 19  ALT 0 - 55 U/L 18     RADIOGRAPHIC STUDIES: I have personally reviewed the radiological images as listed and agreed with the findings in the report. No results found.  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 Patient notes that she remembers having elevated WBC even prior to this. Her leukocytosis is primarily neutrophilia with borderline monocytosis.  Peripheral blood smear does not show any increased blasts or abnormal myeloid maturation. No unaccompanied thrombocytosis or polycythemia. No overt hepatosplenomegaly. LDH is within normal limits with no evidence of lymphadenopathy or hepatosplenomegaly.  Denies recent due to steroids.  Overall picture does more consistent with a reactive process - from her chronic inflammatory disorder that his rheumatoid arthritis, skin infection, nail inflammation/infection,  active significant smoking, possible inflammatory lung disorder. Plan -Patient is immunosuppressed due to her humira for RA, which she has been on for 21 years as per her report. This is known to suppress cell-mediated immunity and can increase risk of fungal infections, PJP, Cryptococcus and other atypical infections. -Since she has noted no skin lesions some respiratory symptoms nail changes and a skin rash that was responsive to fluconazole the differentials would include a fungal infection or Cryptococcus. -Might need consideration for an infectious disease evaluation. -We will send out Cryptococcus serum antigen test. -Could also be at risk for disseminated fungal infections. Further management and evaluation per primary care physician and rheumatology. -Peripheral blood smear and clinical presentation not overtly concerning for myeloproliferative neoplasm. -will get BCR-ABL test to r/o clonal leucocytosis -Counseled patient on smoking cessation. -Optimal control of rheumatoid arthritis. Will check a sedimentation rate and CRP to check for markers of systemic inflammation. -Continue follow-up with pulmonology for monitoring and management of lung nodule and possible interstitial lung disease. -Would hold off on any bone marrow examinations at this time unless there is a significant change in her blood counts. -Patient is disinclined to continue follow-up with [place] since she has " so many doctors" -Would recommend repeat CBC with differential with her primary care physician in 3-4 months. -If there is significant increase in leukocytosis or significant CBC abnormalities kindly reconsult [place].  Labs today RTC with Dr Irene Limbo on an as needed basis  All of the patients questions were answered with apparent satisfaction. The patient knows to call the clinic with any problems, questions or concerns.  I spent 40 minutes counseling the patient face to face. The total time spent in the appointment was 55  minutes and more than 50% was on counseling and direct patient cares.    Sullivan Lone MD [place] AAHIVMS [facility] Hematology/Oncology Physician [facility]  (Office):       [phone] (Work cell):  [phone] (Fax):           [phone]  11/30/2016 1:50 PM

## 2016-12-01 LAB — SEDIMENTATION RATE: Sedimentation Rate-Westergren: 30 mm/hr (ref 0–40)

## 2016-12-01 LAB — C-REACTIVE PROTEIN: CRP: 4.5 mg/L (ref 0.0–4.9)

## 2016-12-02 LAB — CRYPTOCOCCAL ANTIGEN: Cryptococcus Antigen, Serum: NEGATIVE

## 2017-08-03 ENCOUNTER — Other Ambulatory Visit: Payer: Self-pay | Admitting: Internal Medicine

## 2017-08-03 DIAGNOSIS — M5136 Other intervertebral disc degeneration, lumbar region: Secondary | ICD-10-CM

## 2017-08-30 ENCOUNTER — Other Ambulatory Visit: Payer: Self-pay | Admitting: Internal Medicine

## 2017-08-30 DIAGNOSIS — M5136 Other intervertebral disc degeneration, lumbar region: Secondary | ICD-10-CM

## 2017-08-30 DIAGNOSIS — M5430 Sciatica, unspecified side: Secondary | ICD-10-CM

## 2017-09-02 ENCOUNTER — Ambulatory Visit
Admission: RE | Admit: 2017-09-02 | Discharge: 2017-09-02 | Disposition: A | Discharge status: Home / Self Care | Payer: 59 | Source: Ambulatory Visit | Attending: Internal Medicine | Admitting: Internal Medicine

## 2017-09-02 DIAGNOSIS — M5430 Sciatica, unspecified side: Secondary | ICD-10-CM

## 2017-09-02 DIAGNOSIS — M5136 Other intervertebral disc degeneration, lumbar region: Secondary | ICD-10-CM

## 2017-09-02 IMAGING — MR MR LUMBAR SPINE W/O CM
5 series · 44 of 48 positions shown · non-contrast
Comparison: None.

CLINICAL DATA: Severe burning low back pain radiating to RIGHT
lower extremity since [DATE]. Evaluate sciatica.

EXAM:
MRI LUMBAR SPINE WITHOUT CONTRAST
TECHNIQUE: Multiplanar, multisequence MR imaging of the lumbar spine was
performed. No intravenous contrast was administered.

[Series 3: tirm sag · sagittal · 4.0mm · 0.55mm/px · 6 of 12 slices shown]
[im 1/12]
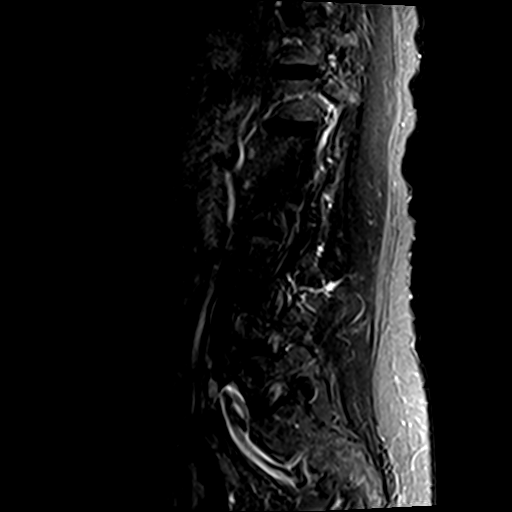
[im 3/12]
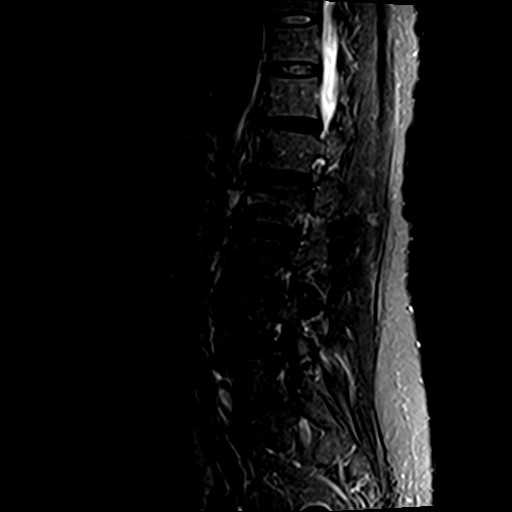
[im 5/12]
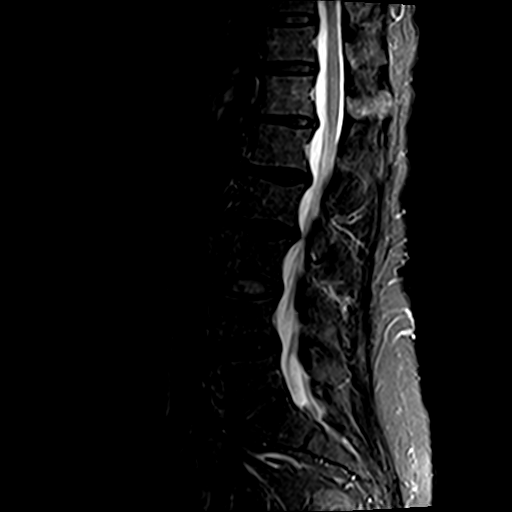
[im 7/12]
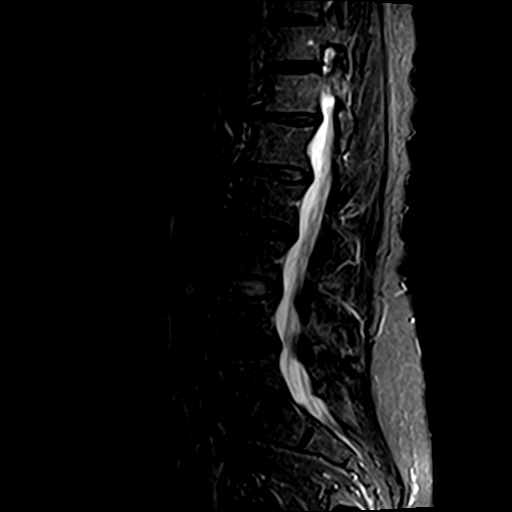
[im 9/12]
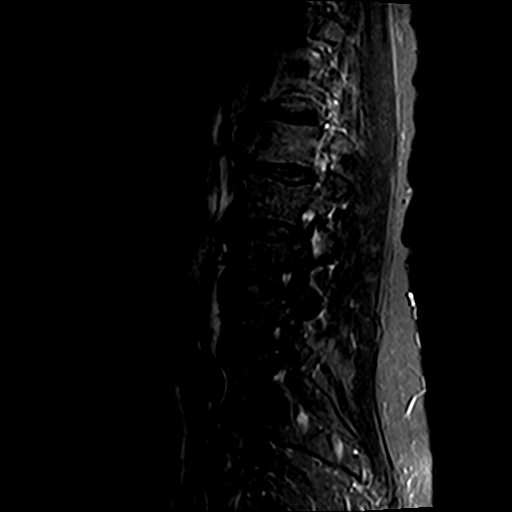
[im 12/12]
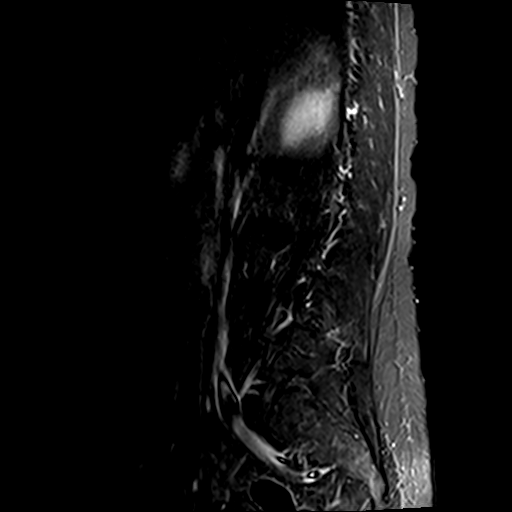

[Series 4: T1 · sagittal · 4.0mm · 0.88mm/px · 6 of 12 slices shown (1 of 2)]
[im 1/12]
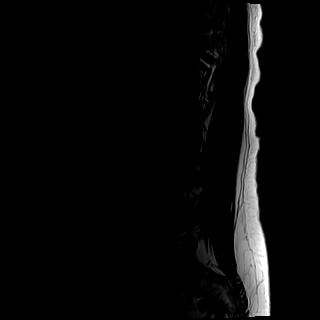
[im 3/12]
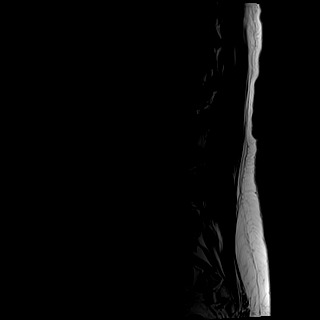
[im 5/12]
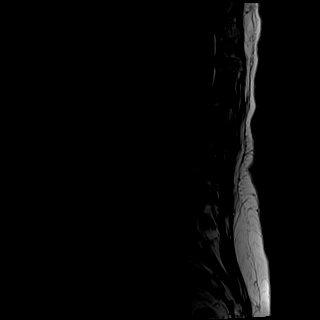
[im 7/12]
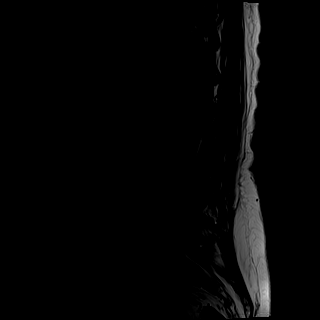
[im 9/12]
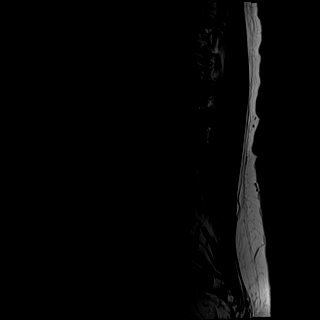
[im 12/12]
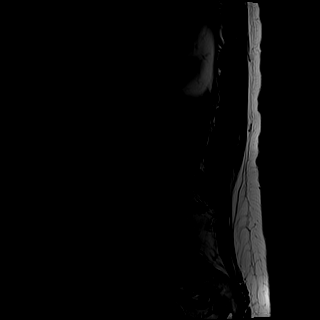

[Series 5: T2 post-contrast · sagittal · 4.0mm · 0.88mm/px · 4 of 9 slices shown]
[im 1/9]
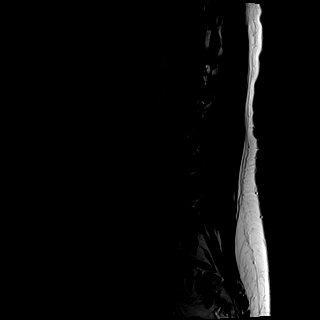
[im 3/9]
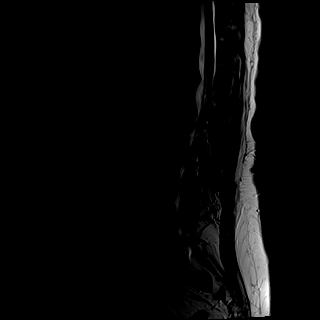
[im 6/9]
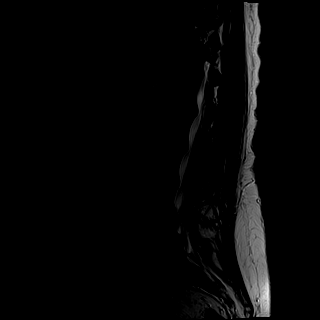
[im 9/9]
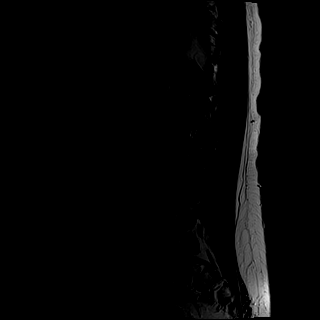

[Series 6: T1 · axial · 4.0mm · 0.78mm/px · z∈[-67,+135]mm · 12 of 34 slices shown (2 of 2)]
[im 1/34]
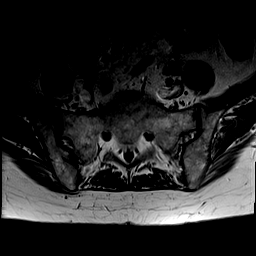
[im 3/34]
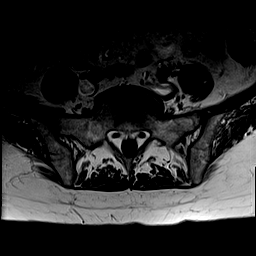
[im 5/34]
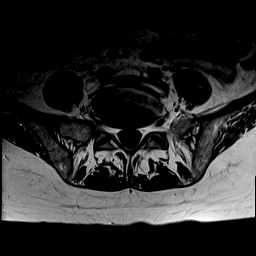
[im 7/34]
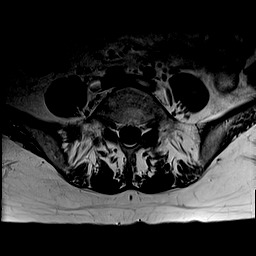
[im 9/34]
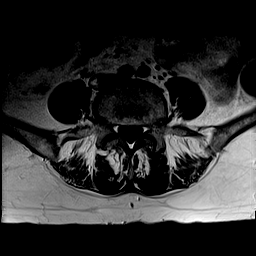
[im 12/34]
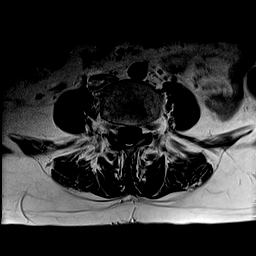
[im 16/34]
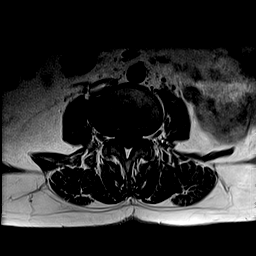
[im 18/34]
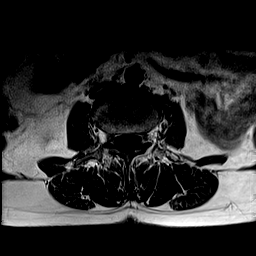
[im 20/34]
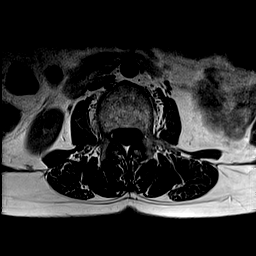
[im 25/34]
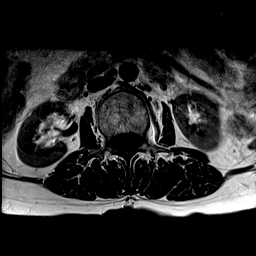
[im 29/34]
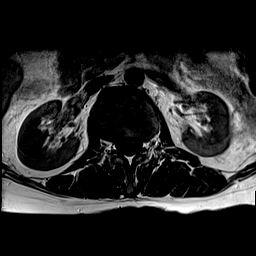
[im 34/34]
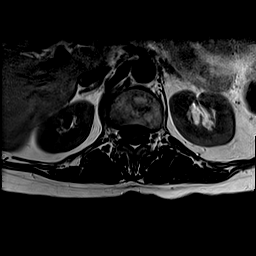

[Series 7: T2 · axial · 4.0mm · 0.78mm/px · z∈[-67,+135]mm · 16 of 34 slices shown]
[im 1/34]
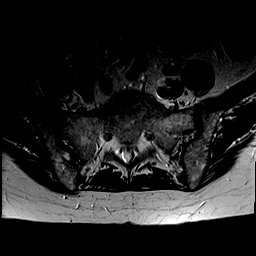
[im 3/34]
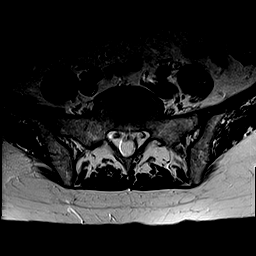
[im 5/34]
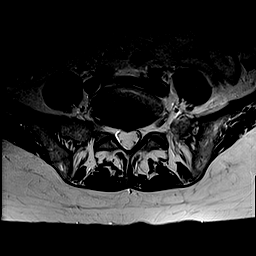
[im 7/34]
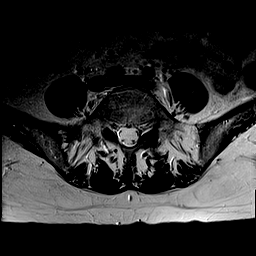
[im 9/34]
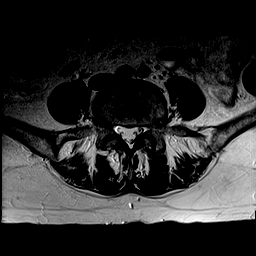
[im 12/34]
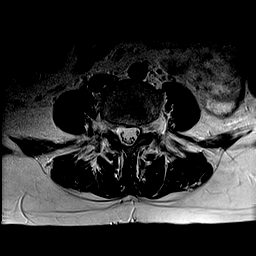
[im 14/34]
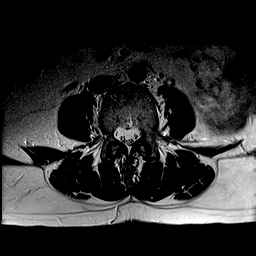
[im 16/34]
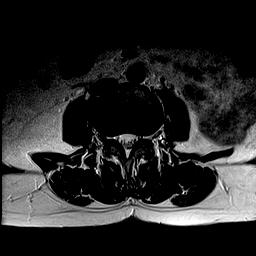
[im 18/34]
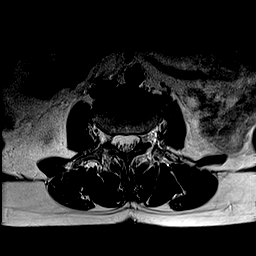
[im 20/34]
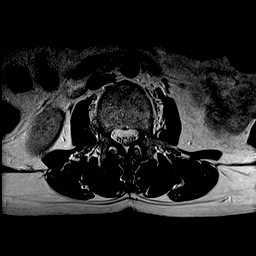
[im 23/34]
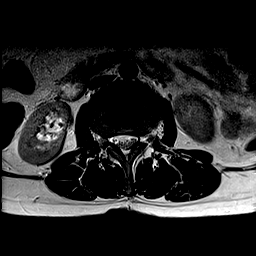
[im 25/34]
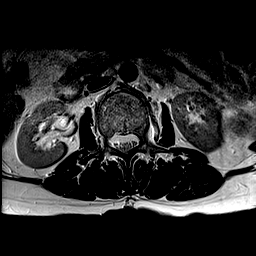
[im 27/34]
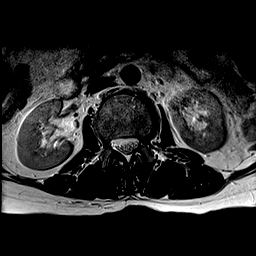
[im 29/34]
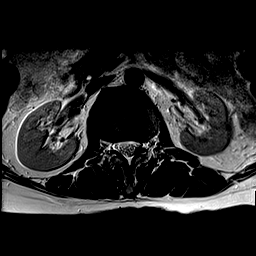
[im 31/34]
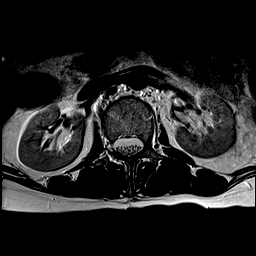
[im 34/34]
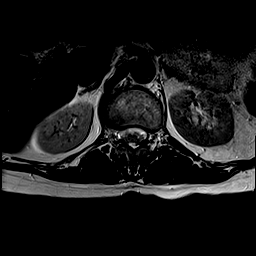

[44 of 48 positions shown; findings below may reference images not displayed]

FINDINGS: SEGMENTATION: For the purposes of this report, the last well-formed
intervertebral disc is reported as L5-S1.

ALIGNMENT: Maintained lumbar lordosis. No malalignment.

VERTEBRAE:Vertebral bodies are intact. Moderate T12-L1 through L2-3
disc height loss with multilevel mild disc desiccation mild chronic
discogenic endplate changes. No acute or abnormal bone marrow
signal.

CONUS MEDULLARIS AND CAUDA EQUINA: Conus medullaris terminates at
L1-2 and demonstrates normal morphology and signal characteristics.
Cauda equina is normal.

PARASPINAL AND OTHER SOFT TISSUES: Included prevertebral and
paraspinal soft tissues are normal.

DISC LEVELS:

T12-L1, L1-2: Small broad-based disc bulge, no canal stenosis or
neural foraminal narrowing.

L2-3: Small broad-based disc bulge asymmetric to the RIGHT and small
LEFT subarticular disc protrusion. Mild facet arthropathy and
ligamentum flavum redundancy. Mild canal stenosis with narrowed LEFT
lateral recess which may affect the traversing LEFT L3 nerve. Mild

RIGHT neural foraminal narrowing.

L3-4: Interval bulging asymmetric to the RIGHT. Mild facet
arthropathy without canal stenosis. Minimal neural foraminal
narrowing.

L5-S1: No disc bulge, canal stenosis nor neural foraminal narrowing.
IMPRESSION: 1. Degenerative change of lumbar spine without acute osseous
process.
2. Mild canal stenosis L2-3. Encroachment upon the traversing LEFT
L3 nerve.
3. Mild RIGHT L2-3 neural foraminal narrowing.

## 2017-09-14 ENCOUNTER — Ambulatory Visit
Admission: RE | Admit: 2017-09-14 | Discharge: 2017-09-14 | Disposition: A | Discharge status: Home / Self Care | Payer: 59 | Source: Ambulatory Visit | Attending: Internal Medicine | Admitting: Internal Medicine

## 2017-09-14 DIAGNOSIS — M5136 Other intervertebral disc degeneration, lumbar region: Secondary | ICD-10-CM

## 2017-09-14 MED ORDER — IOPAMIDOL (ISOVUE-M 200) INJECTION 41%
1.0000 mL | Freq: Once | INTRAMUSCULAR | Status: AC
  Administered 2017-09-14: 1 mL via EPIDURAL

## 2017-09-14 MED ORDER — METHYLPREDNISOLONE ACETATE 40 MG/ML INJ SUSP (RADIOLOG
120.0000 mg | Freq: Once | INTRAMUSCULAR | Status: AC
  Administered 2017-09-14: 120 mg via EPIDURAL

## 2017-09-14 NOTE — Discharge Instructions (Signed)

## 2018-05-30 DIAGNOSIS — M0609 Rheumatoid arthritis without rheumatoid factor, multiple sites: Secondary | ICD-10-CM | POA: Diagnosis not present

## 2018-05-30 DIAGNOSIS — Z79899 Other long term (current) drug therapy: Secondary | ICD-10-CM | POA: Diagnosis not present

## 2018-06-26 ENCOUNTER — Encounter: Payer: Self-pay | Admitting: Podiatry

## 2018-06-26 ENCOUNTER — Ambulatory Visit (INDEPENDENT_AMBULATORY_CARE_PROVIDER_SITE_OTHER): Payer: 59 | Admitting: Podiatry

## 2018-06-26 DIAGNOSIS — B351 Tinea unguium: Secondary | ICD-10-CM

## 2018-06-26 DIAGNOSIS — L600 Ingrowing nail: Secondary | ICD-10-CM | POA: Diagnosis not present

## 2018-06-29 NOTE — Progress Notes (Signed)
Subjective:   Patient ID: Wanda Thompson, female   DOB: 67 y.o.   MRN: [id number]   HPI Patient presents with some concerns about the nail and some light thickness of the beds but no current pain   ROS      Objective:  Physical Exam  Neurovascular status intact with moderate yellow discoloration that is localized with no proximal edema erythema or drainage noted     Assessment:  Mild mycotic nail infection localized with no indications of further pathology     Plan:  Advised this patient on trimming techniques topical medications and will be seen back on an as-needed basis

## 2018-09-05 DIAGNOSIS — Z79899 Other long term (current) drug therapy: Secondary | ICD-10-CM | POA: Diagnosis not present

## 2018-09-05 DIAGNOSIS — M0609 Rheumatoid arthritis without rheumatoid factor, multiple sites: Secondary | ICD-10-CM | POA: Diagnosis not present

## 2018-10-20 DIAGNOSIS — E559 Vitamin D deficiency, unspecified: Secondary | ICD-10-CM | POA: Diagnosis not present

## 2018-10-20 DIAGNOSIS — R82998 Other abnormal findings in urine: Secondary | ICD-10-CM | POA: Diagnosis not present

## 2018-10-20 DIAGNOSIS — E063 Autoimmune thyroiditis: Secondary | ICD-10-CM | POA: Diagnosis not present

## 2018-10-20 DIAGNOSIS — Z79899 Other long term (current) drug therapy: Secondary | ICD-10-CM | POA: Diagnosis not present

## 2018-10-27 DIAGNOSIS — Z23 Encounter for immunization: Secondary | ICD-10-CM | POA: Diagnosis not present

## 2018-10-27 DIAGNOSIS — M810 Age-related osteoporosis without current pathological fracture: Secondary | ICD-10-CM | POA: Diagnosis not present

## 2018-10-27 DIAGNOSIS — Z79899 Other long term (current) drug therapy: Secondary | ICD-10-CM | POA: Diagnosis not present

## 2018-10-27 DIAGNOSIS — Z0000 Encounter for general adult medical examination without abnormal findings: Secondary | ICD-10-CM | POA: Diagnosis not present

## 2018-10-27 DIAGNOSIS — E063 Autoimmune thyroiditis: Secondary | ICD-10-CM | POA: Diagnosis not present

## 2018-10-27 DIAGNOSIS — R69 Illness, unspecified: Secondary | ICD-10-CM | POA: Diagnosis not present

## 2018-10-27 DIAGNOSIS — Z1331 Encounter for screening for depression: Secondary | ICD-10-CM | POA: Diagnosis not present

## 2018-10-27 DIAGNOSIS — D72829 Elevated white blood cell count, unspecified: Secondary | ICD-10-CM | POA: Diagnosis not present

## 2018-10-27 DIAGNOSIS — R918 Other nonspecific abnormal finding of lung field: Secondary | ICD-10-CM | POA: Diagnosis not present

## 2018-10-27 DIAGNOSIS — M069 Rheumatoid arthritis, unspecified: Secondary | ICD-10-CM | POA: Diagnosis not present

## 2019-02-14 DIAGNOSIS — H524 Presbyopia: Secondary | ICD-10-CM | POA: Diagnosis not present

## 2019-02-14 DIAGNOSIS — H11153 Pinguecula, bilateral: Secondary | ICD-10-CM | POA: Diagnosis not present

## 2019-02-14 DIAGNOSIS — H04123 Dry eye syndrome of bilateral lacrimal glands: Secondary | ICD-10-CM | POA: Diagnosis not present

## 2019-02-14 DIAGNOSIS — H52223 Regular astigmatism, bilateral: Secondary | ICD-10-CM | POA: Diagnosis not present

## 2019-02-14 DIAGNOSIS — M069 Rheumatoid arthritis, unspecified: Secondary | ICD-10-CM | POA: Diagnosis not present

## 2019-02-14 DIAGNOSIS — H2513 Age-related nuclear cataract, bilateral: Secondary | ICD-10-CM | POA: Diagnosis not present

## 2019-02-14 DIAGNOSIS — H5203 Hypermetropia, bilateral: Secondary | ICD-10-CM | POA: Diagnosis not present

## 2019-03-05 DIAGNOSIS — Z03818 Encounter for observation for suspected exposure to other biological agents ruled out: Secondary | ICD-10-CM | POA: Diagnosis not present

## 2019-05-17 DIAGNOSIS — Z23 Encounter for immunization: Secondary | ICD-10-CM | POA: Diagnosis not present

## 2019-07-18 DIAGNOSIS — Z03818 Encounter for observation for suspected exposure to other biological agents ruled out: Secondary | ICD-10-CM | POA: Diagnosis not present

## 2019-07-30 DIAGNOSIS — L409 Psoriasis, unspecified: Secondary | ICD-10-CM | POA: Diagnosis not present

## 2019-07-30 DIAGNOSIS — M0609 Rheumatoid arthritis without rheumatoid factor, multiple sites: Secondary | ICD-10-CM | POA: Diagnosis not present

## 2019-07-30 DIAGNOSIS — Z5181 Encounter for therapeutic drug level monitoring: Secondary | ICD-10-CM | POA: Diagnosis not present

## 2019-07-30 DIAGNOSIS — Z79899 Other long term (current) drug therapy: Secondary | ICD-10-CM | POA: Diagnosis not present

## 2019-10-31 DIAGNOSIS — E063 Autoimmune thyroiditis: Secondary | ICD-10-CM | POA: Diagnosis not present

## 2019-10-31 DIAGNOSIS — Z0000 Encounter for general adult medical examination without abnormal findings: Secondary | ICD-10-CM | POA: Diagnosis not present

## 2019-10-31 DIAGNOSIS — Z7689 Persons encountering health services in other specified circumstances: Secondary | ICD-10-CM | POA: Diagnosis not present

## 2019-10-31 DIAGNOSIS — R7989 Other specified abnormal findings of blood chemistry: Secondary | ICD-10-CM | POA: Diagnosis not present

## 2019-10-31 DIAGNOSIS — M810 Age-related osteoporosis without current pathological fracture: Secondary | ICD-10-CM | POA: Diagnosis not present

## 2019-10-31 DIAGNOSIS — R6889 Other general symptoms and signs: Secondary | ICD-10-CM | POA: Diagnosis not present

## 2019-11-07 DIAGNOSIS — D72829 Elevated white blood cell count, unspecified: Secondary | ICD-10-CM | POA: Diagnosis not present

## 2019-11-07 DIAGNOSIS — Z79899 Other long term (current) drug therapy: Secondary | ICD-10-CM | POA: Diagnosis not present

## 2019-11-07 DIAGNOSIS — R6889 Other general symptoms and signs: Secondary | ICD-10-CM | POA: Diagnosis not present

## 2019-11-07 DIAGNOSIS — M069 Rheumatoid arthritis, unspecified: Secondary | ICD-10-CM | POA: Diagnosis not present

## 2019-11-07 DIAGNOSIS — E559 Vitamin D deficiency, unspecified: Secondary | ICD-10-CM | POA: Diagnosis not present

## 2019-11-07 DIAGNOSIS — Z0000 Encounter for general adult medical examination without abnormal findings: Secondary | ICD-10-CM | POA: Diagnosis not present

## 2019-11-07 DIAGNOSIS — R69 Illness, unspecified: Secondary | ICD-10-CM | POA: Diagnosis not present

## 2019-11-07 DIAGNOSIS — R82998 Other abnormal findings in urine: Secondary | ICD-10-CM | POA: Diagnosis not present

## 2019-11-07 DIAGNOSIS — E063 Autoimmune thyroiditis: Secondary | ICD-10-CM | POA: Diagnosis not present

## 2019-11-07 DIAGNOSIS — M810 Age-related osteoporosis without current pathological fracture: Secondary | ICD-10-CM | POA: Diagnosis not present

## 2019-11-09 DIAGNOSIS — Z1212 Encounter for screening for malignant neoplasm of rectum: Secondary | ICD-10-CM | POA: Diagnosis not present

## 2019-11-30 DIAGNOSIS — E039 Hypothyroidism, unspecified: Secondary | ICD-10-CM | POA: Diagnosis not present

## 2019-11-30 DIAGNOSIS — E063 Autoimmune thyroiditis: Secondary | ICD-10-CM | POA: Diagnosis not present

## 2020-01-11 DIAGNOSIS — E063 Autoimmune thyroiditis: Secondary | ICD-10-CM | POA: Diagnosis not present

## 2020-01-11 DIAGNOSIS — E069 Thyroiditis, unspecified: Secondary | ICD-10-CM | POA: Diagnosis not present

## 2020-01-30 DIAGNOSIS — L400 Psoriasis vulgaris: Secondary | ICD-10-CM | POA: Diagnosis not present

## 2020-01-30 DIAGNOSIS — D0462 Carcinoma in situ of skin of left upper limb, including shoulder: Secondary | ICD-10-CM | POA: Diagnosis not present

## 2020-01-30 DIAGNOSIS — D0461 Carcinoma in situ of skin of right upper limb, including shoulder: Secondary | ICD-10-CM | POA: Diagnosis not present

## 2020-01-30 DIAGNOSIS — D485 Neoplasm of uncertain behavior of skin: Secondary | ICD-10-CM | POA: Diagnosis not present

## 2020-03-03 DIAGNOSIS — L409 Psoriasis, unspecified: Secondary | ICD-10-CM | POA: Diagnosis not present

## 2020-03-03 DIAGNOSIS — M0609 Rheumatoid arthritis without rheumatoid factor, multiple sites: Secondary | ICD-10-CM | POA: Diagnosis not present

## 2020-03-03 DIAGNOSIS — Z79899 Other long term (current) drug therapy: Secondary | ICD-10-CM | POA: Diagnosis not present

## 2020-04-03 DIAGNOSIS — H524 Presbyopia: Secondary | ICD-10-CM | POA: Diagnosis not present

## 2020-04-03 DIAGNOSIS — H52223 Regular astigmatism, bilateral: Secondary | ICD-10-CM | POA: Diagnosis not present

## 2020-06-04 DIAGNOSIS — Z23 Encounter for immunization: Secondary | ICD-10-CM | POA: Diagnosis not present

## 2020-07-09 DIAGNOSIS — L570 Actinic keratosis: Secondary | ICD-10-CM | POA: Diagnosis not present

## 2020-07-09 DIAGNOSIS — L400 Psoriasis vulgaris: Secondary | ICD-10-CM | POA: Diagnosis not present

## 2020-07-09 DIAGNOSIS — L03011 Cellulitis of right finger: Secondary | ICD-10-CM | POA: Diagnosis not present

## 2020-07-09 DIAGNOSIS — Z85828 Personal history of other malignant neoplasm of skin: Secondary | ICD-10-CM | POA: Diagnosis not present

## 2020-09-05 DIAGNOSIS — Z79899 Other long term (current) drug therapy: Secondary | ICD-10-CM | POA: Diagnosis not present

## 2020-09-05 DIAGNOSIS — M0609 Rheumatoid arthritis without rheumatoid factor, multiple sites: Secondary | ICD-10-CM | POA: Diagnosis not present

## 2020-09-05 DIAGNOSIS — L409 Psoriasis, unspecified: Secondary | ICD-10-CM | POA: Diagnosis not present

## 2020-09-08 DIAGNOSIS — L400 Psoriasis vulgaris: Secondary | ICD-10-CM | POA: Diagnosis not present

## 2020-09-08 DIAGNOSIS — L570 Actinic keratosis: Secondary | ICD-10-CM | POA: Diagnosis not present

## 2020-11-11 DIAGNOSIS — D72829 Elevated white blood cell count, unspecified: Secondary | ICD-10-CM | POA: Diagnosis not present

## 2020-11-11 DIAGNOSIS — E069 Thyroiditis, unspecified: Secondary | ICD-10-CM | POA: Diagnosis not present

## 2020-11-11 DIAGNOSIS — E559 Vitamin D deficiency, unspecified: Secondary | ICD-10-CM | POA: Diagnosis not present

## 2020-11-11 DIAGNOSIS — E063 Autoimmune thyroiditis: Secondary | ICD-10-CM | POA: Diagnosis not present

## 2020-11-18 DIAGNOSIS — Z1331 Encounter for screening for depression: Secondary | ICD-10-CM | POA: Diagnosis not present

## 2020-11-18 DIAGNOSIS — R69 Illness, unspecified: Secondary | ICD-10-CM | POA: Diagnosis not present

## 2020-11-18 DIAGNOSIS — R82998 Other abnormal findings in urine: Secondary | ICD-10-CM | POA: Diagnosis not present

## 2020-11-18 DIAGNOSIS — D751 Secondary polycythemia: Secondary | ICD-10-CM | POA: Diagnosis not present

## 2020-11-18 DIAGNOSIS — R634 Abnormal weight loss: Secondary | ICD-10-CM | POA: Diagnosis not present

## 2020-11-18 DIAGNOSIS — M810 Age-related osteoporosis without current pathological fracture: Secondary | ICD-10-CM | POA: Diagnosis not present

## 2020-11-18 DIAGNOSIS — Z1389 Encounter for screening for other disorder: Secondary | ICD-10-CM | POA: Diagnosis not present

## 2020-11-18 DIAGNOSIS — M069 Rheumatoid arthritis, unspecified: Secondary | ICD-10-CM | POA: Diagnosis not present

## 2020-11-18 DIAGNOSIS — E559 Vitamin D deficiency, unspecified: Secondary | ICD-10-CM | POA: Diagnosis not present

## 2020-11-18 DIAGNOSIS — Z0000 Encounter for general adult medical examination without abnormal findings: Secondary | ICD-10-CM | POA: Diagnosis not present

## 2020-11-18 DIAGNOSIS — E063 Autoimmune thyroiditis: Secondary | ICD-10-CM | POA: Diagnosis not present

## 2020-11-18 DIAGNOSIS — Z1212 Encounter for screening for malignant neoplasm of rectum: Secondary | ICD-10-CM | POA: Diagnosis not present

## 2020-11-24 ENCOUNTER — Other Ambulatory Visit: Payer: Self-pay | Admitting: Internal Medicine

## 2020-11-24 DIAGNOSIS — F17201 Nicotine dependence, unspecified, in remission: Secondary | ICD-10-CM

## 2020-12-04 ENCOUNTER — Other Ambulatory Visit: Payer: Self-pay | Admitting: Internal Medicine

## 2020-12-04 DIAGNOSIS — Z1231 Encounter for screening mammogram for malignant neoplasm of breast: Secondary | ICD-10-CM

## 2020-12-18 ENCOUNTER — Ambulatory Visit
Admission: RE | Admit: 2020-12-18 | Discharge: 2020-12-18 | Disposition: A | Discharge status: Home / Self Care | Payer: Medicare HMO | Source: Ambulatory Visit | Attending: Internal Medicine | Admitting: Internal Medicine

## 2020-12-18 DIAGNOSIS — J984 Other disorders of lung: Secondary | ICD-10-CM | POA: Diagnosis not present

## 2020-12-18 DIAGNOSIS — Z87891 Personal history of nicotine dependence: Secondary | ICD-10-CM | POA: Diagnosis not present

## 2020-12-18 DIAGNOSIS — F17201 Nicotine dependence, unspecified, in remission: Secondary | ICD-10-CM

## 2020-12-18 DIAGNOSIS — J432 Centrilobular emphysema: Secondary | ICD-10-CM | POA: Diagnosis not present

## 2020-12-18 DIAGNOSIS — I2510 Atherosclerotic heart disease of native coronary artery without angina pectoris: Secondary | ICD-10-CM | POA: Diagnosis not present

## 2021-01-23 ENCOUNTER — Other Ambulatory Visit: Payer: Self-pay

## 2021-01-23 ENCOUNTER — Ambulatory Visit
Admission: RE | Admit: 2021-01-23 | Discharge: 2021-01-23 | Disposition: A | Discharge status: Home / Self Care | Payer: Medicare HMO | Source: Ambulatory Visit | Attending: Internal Medicine | Admitting: Internal Medicine

## 2021-01-23 DIAGNOSIS — Z1231 Encounter for screening mammogram for malignant neoplasm of breast: Secondary | ICD-10-CM | POA: Diagnosis not present

## 2021-02-06 DIAGNOSIS — M0609 Rheumatoid arthritis without rheumatoid factor, multiple sites: Secondary | ICD-10-CM | POA: Diagnosis not present

## 2021-02-06 DIAGNOSIS — Z79899 Other long term (current) drug therapy: Secondary | ICD-10-CM | POA: Diagnosis not present

## 2021-02-12 DIAGNOSIS — Z79899 Other long term (current) drug therapy: Secondary | ICD-10-CM | POA: Diagnosis not present

## 2021-02-12 DIAGNOSIS — L409 Psoriasis, unspecified: Secondary | ICD-10-CM | POA: Diagnosis not present

## 2021-02-12 DIAGNOSIS — M0609 Rheumatoid arthritis without rheumatoid factor, multiple sites: Secondary | ICD-10-CM | POA: Diagnosis not present

## 2021-06-03 DIAGNOSIS — B078 Other viral warts: Secondary | ICD-10-CM | POA: Diagnosis not present

## 2021-06-03 DIAGNOSIS — L570 Actinic keratosis: Secondary | ICD-10-CM | POA: Diagnosis not present

## 2021-06-03 DIAGNOSIS — L80 Vitiligo: Secondary | ICD-10-CM | POA: Diagnosis not present

## 2021-06-03 DIAGNOSIS — L821 Other seborrheic keratosis: Secondary | ICD-10-CM | POA: Diagnosis not present

## 2021-06-06 DIAGNOSIS — Z23 Encounter for immunization: Secondary | ICD-10-CM | POA: Diagnosis not present

## 2021-06-23 DIAGNOSIS — H2513 Age-related nuclear cataract, bilateral: Secondary | ICD-10-CM | POA: Diagnosis not present

## 2021-06-23 DIAGNOSIS — H5203 Hypermetropia, bilateral: Secondary | ICD-10-CM | POA: Diagnosis not present

## 2021-06-26 DIAGNOSIS — M5136 Other intervertebral disc degeneration, lumbar region: Secondary | ICD-10-CM | POA: Diagnosis not present

## 2021-06-26 DIAGNOSIS — N390 Urinary tract infection, site not specified: Secondary | ICD-10-CM | POA: Diagnosis not present

## 2021-06-26 DIAGNOSIS — R350 Frequency of micturition: Secondary | ICD-10-CM | POA: Diagnosis not present

## 2021-06-26 DIAGNOSIS — R053 Chronic cough: Secondary | ICD-10-CM | POA: Diagnosis not present

## 2021-06-26 DIAGNOSIS — M069 Rheumatoid arthritis, unspecified: Secondary | ICD-10-CM | POA: Diagnosis not present

## 2021-06-26 DIAGNOSIS — R69 Illness, unspecified: Secondary | ICD-10-CM | POA: Diagnosis not present

## 2021-06-26 DIAGNOSIS — J439 Emphysema, unspecified: Secondary | ICD-10-CM | POA: Diagnosis not present

## 2021-06-26 DIAGNOSIS — M5417 Radiculopathy, lumbosacral region: Secondary | ICD-10-CM | POA: Diagnosis not present

## 2021-06-26 DIAGNOSIS — R634 Abnormal weight loss: Secondary | ICD-10-CM | POA: Diagnosis not present

## 2021-07-06 ENCOUNTER — Ambulatory Visit (HOSPITAL_COMMUNITY)
Admission: RE | Admit: 2021-07-06 | Discharge: 2021-07-06 | Disposition: A | Discharge status: Home / Self Care | Payer: Medicare HMO | Source: Ambulatory Visit | Attending: Internal Medicine | Admitting: Internal Medicine

## 2021-07-06 ENCOUNTER — Other Ambulatory Visit: Payer: Self-pay

## 2021-07-06 ENCOUNTER — Other Ambulatory Visit: Payer: Self-pay | Admitting: Internal Medicine

## 2021-07-06 DIAGNOSIS — M5136 Other intervertebral disc degeneration, lumbar region: Secondary | ICD-10-CM | POA: Diagnosis not present

## 2021-07-08 ENCOUNTER — Other Ambulatory Visit: Payer: Self-pay | Admitting: Neurosurgery

## 2021-07-08 DIAGNOSIS — M5416 Radiculopathy, lumbar region: Secondary | ICD-10-CM | POA: Diagnosis not present

## 2021-07-14 ENCOUNTER — Other Ambulatory Visit: Payer: Self-pay | Admitting: Internal Medicine

## 2021-07-14 DIAGNOSIS — M5416 Radiculopathy, lumbar region: Secondary | ICD-10-CM

## 2021-07-15 ENCOUNTER — Ambulatory Visit
Admission: RE | Admit: 2021-07-15 | Discharge: 2021-07-15 | Disposition: A | Discharge status: Home / Self Care | Payer: Medicare HMO | Source: Ambulatory Visit | Attending: Neurosurgery | Admitting: Neurosurgery

## 2021-07-15 ENCOUNTER — Other Ambulatory Visit: Payer: Self-pay

## 2021-07-15 DIAGNOSIS — M5416 Radiculopathy, lumbar region: Secondary | ICD-10-CM | POA: Diagnosis not present

## 2021-07-15 MED ORDER — IOPAMIDOL (ISOVUE-M 200) INJECTION 41%
1.0000 mL | Freq: Once | INTRAMUSCULAR | Status: AC
  Administered 2021-07-15: 1 mL via EPIDURAL

## 2021-07-15 MED ORDER — METHYLPREDNISOLONE ACETATE 40 MG/ML INJ SUSP (RADIOLOG
80.0000 mg | Freq: Once | INTRAMUSCULAR | Status: AC
  Administered 2021-07-15: 80 mg via EPIDURAL

## 2021-07-15 NOTE — Discharge Instructions (Signed)

## 2021-07-27 ENCOUNTER — Other Ambulatory Visit: Payer: Self-pay | Admitting: Neurosurgery

## 2021-07-31 ENCOUNTER — Other Ambulatory Visit: Payer: Self-pay | Admitting: Neurosurgery

## 2021-07-31 DIAGNOSIS — M5416 Radiculopathy, lumbar region: Secondary | ICD-10-CM

## 2021-08-05 ENCOUNTER — Other Ambulatory Visit: Payer: Self-pay

## 2021-08-05 ENCOUNTER — Ambulatory Visit
Admission: RE | Admit: 2021-08-05 | Discharge: 2021-08-05 | Disposition: A | Discharge status: Home / Self Care | Payer: Medicare HMO | Source: Ambulatory Visit | Attending: Neurosurgery | Admitting: Neurosurgery

## 2021-08-05 DIAGNOSIS — M47817 Spondylosis without myelopathy or radiculopathy, lumbosacral region: Secondary | ICD-10-CM | POA: Diagnosis not present

## 2021-08-05 DIAGNOSIS — M5416 Radiculopathy, lumbar region: Secondary | ICD-10-CM

## 2021-08-05 DIAGNOSIS — M5116 Intervertebral disc disorders with radiculopathy, lumbar region: Secondary | ICD-10-CM | POA: Diagnosis not present

## 2021-08-05 MED ORDER — IOPAMIDOL (ISOVUE-M 200) INJECTION 41%
1.0000 mL | Freq: Once | INTRAMUSCULAR | Status: AC
  Administered 2021-08-05: 13:00:00 1 mL via EPIDURAL

## 2021-08-05 MED ORDER — METHYLPREDNISOLONE ACETATE 40 MG/ML INJ SUSP (RADIOLOG
80.0000 mg | Freq: Once | INTRAMUSCULAR | Status: AC
  Administered 2021-08-05: 13:00:00 80 mg via EPIDURAL

## 2021-08-05 NOTE — Discharge Instructions (Signed)

## 2021-08-20 DIAGNOSIS — M0609 Rheumatoid arthritis without rheumatoid factor, multiple sites: Secondary | ICD-10-CM | POA: Diagnosis not present

## 2021-08-20 DIAGNOSIS — L409 Psoriasis, unspecified: Secondary | ICD-10-CM | POA: Diagnosis not present

## 2021-08-20 DIAGNOSIS — Z79899 Other long term (current) drug therapy: Secondary | ICD-10-CM | POA: Diagnosis not present

## 2021-08-31 DIAGNOSIS — R69 Illness, unspecified: Secondary | ICD-10-CM | POA: Diagnosis not present

## 2021-08-31 DIAGNOSIS — G5701 Lesion of sciatic nerve, right lower limb: Secondary | ICD-10-CM | POA: Diagnosis not present

## 2021-08-31 DIAGNOSIS — M5416 Radiculopathy, lumbar region: Secondary | ICD-10-CM | POA: Diagnosis not present

## 2021-09-09 DIAGNOSIS — G5701 Lesion of sciatic nerve, right lower limb: Secondary | ICD-10-CM | POA: Diagnosis not present

## 2021-10-05 DIAGNOSIS — M5416 Radiculopathy, lumbar region: Secondary | ICD-10-CM | POA: Diagnosis not present

## 2021-10-05 DIAGNOSIS — G5701 Lesion of sciatic nerve, right lower limb: Secondary | ICD-10-CM | POA: Diagnosis not present

## 2021-10-05 DIAGNOSIS — R69 Illness, unspecified: Secondary | ICD-10-CM | POA: Diagnosis not present

## 2021-10-29 DIAGNOSIS — G5701 Lesion of sciatic nerve, right lower limb: Secondary | ICD-10-CM | POA: Diagnosis not present

## 2021-11-12 DIAGNOSIS — G5701 Lesion of sciatic nerve, right lower limb: Secondary | ICD-10-CM | POA: Diagnosis not present

## 2021-11-16 DIAGNOSIS — R2689 Other abnormalities of gait and mobility: Secondary | ICD-10-CM | POA: Diagnosis not present

## 2021-11-16 DIAGNOSIS — M79604 Pain in right leg: Secondary | ICD-10-CM | POA: Diagnosis not present

## 2021-11-16 DIAGNOSIS — R202 Paresthesia of skin: Secondary | ICD-10-CM | POA: Diagnosis not present

## 2021-11-19 DIAGNOSIS — M79604 Pain in right leg: Secondary | ICD-10-CM | POA: Diagnosis not present

## 2021-11-19 DIAGNOSIS — R2689 Other abnormalities of gait and mobility: Secondary | ICD-10-CM | POA: Diagnosis not present

## 2021-11-19 DIAGNOSIS — R202 Paresthesia of skin: Secondary | ICD-10-CM | POA: Diagnosis not present

## 2021-11-23 DIAGNOSIS — M79604 Pain in right leg: Secondary | ICD-10-CM | POA: Diagnosis not present

## 2021-11-23 DIAGNOSIS — R2689 Other abnormalities of gait and mobility: Secondary | ICD-10-CM | POA: Diagnosis not present

## 2021-11-23 DIAGNOSIS — R202 Paresthesia of skin: Secondary | ICD-10-CM | POA: Diagnosis not present

## 2021-11-26 DIAGNOSIS — E559 Vitamin D deficiency, unspecified: Secondary | ICD-10-CM | POA: Diagnosis not present

## 2021-11-26 DIAGNOSIS — Z79899 Other long term (current) drug therapy: Secondary | ICD-10-CM | POA: Diagnosis not present

## 2021-11-26 DIAGNOSIS — M79604 Pain in right leg: Secondary | ICD-10-CM | POA: Diagnosis not present

## 2021-11-26 DIAGNOSIS — R2689 Other abnormalities of gait and mobility: Secondary | ICD-10-CM | POA: Diagnosis not present

## 2021-11-26 DIAGNOSIS — R7989 Other specified abnormal findings of blood chemistry: Secondary | ICD-10-CM | POA: Diagnosis not present

## 2021-11-26 DIAGNOSIS — R202 Paresthesia of skin: Secondary | ICD-10-CM | POA: Diagnosis not present

## 2021-12-02 DIAGNOSIS — R202 Paresthesia of skin: Secondary | ICD-10-CM | POA: Diagnosis not present

## 2021-12-02 DIAGNOSIS — M79604 Pain in right leg: Secondary | ICD-10-CM | POA: Diagnosis not present

## 2021-12-02 DIAGNOSIS — R2689 Other abnormalities of gait and mobility: Secondary | ICD-10-CM | POA: Diagnosis not present

## 2021-12-03 DIAGNOSIS — Z1339 Encounter for screening examination for other mental health and behavioral disorders: Secondary | ICD-10-CM | POA: Diagnosis not present

## 2021-12-03 DIAGNOSIS — G5701 Lesion of sciatic nerve, right lower limb: Secondary | ICD-10-CM | POA: Diagnosis not present

## 2021-12-03 DIAGNOSIS — Z1331 Encounter for screening for depression: Secondary | ICD-10-CM | POA: Diagnosis not present

## 2021-12-03 DIAGNOSIS — R82998 Other abnormal findings in urine: Secondary | ICD-10-CM | POA: Diagnosis not present

## 2021-12-03 DIAGNOSIS — M810 Age-related osteoporosis without current pathological fracture: Secondary | ICD-10-CM | POA: Diagnosis not present

## 2021-12-03 DIAGNOSIS — Z0000 Encounter for general adult medical examination without abnormal findings: Secondary | ICD-10-CM | POA: Diagnosis not present

## 2021-12-03 DIAGNOSIS — M069 Rheumatoid arthritis, unspecified: Secondary | ICD-10-CM | POA: Diagnosis not present

## 2021-12-03 DIAGNOSIS — R634 Abnormal weight loss: Secondary | ICD-10-CM | POA: Diagnosis not present

## 2021-12-03 DIAGNOSIS — J439 Emphysema, unspecified: Secondary | ICD-10-CM | POA: Diagnosis not present

## 2021-12-03 DIAGNOSIS — E063 Autoimmune thyroiditis: Secondary | ICD-10-CM | POA: Diagnosis not present

## 2021-12-03 DIAGNOSIS — R7989 Other specified abnormal findings of blood chemistry: Secondary | ICD-10-CM | POA: Diagnosis not present

## 2021-12-03 DIAGNOSIS — R8290 Unspecified abnormal findings in urine: Secondary | ICD-10-CM | POA: Diagnosis not present

## 2021-12-03 DIAGNOSIS — D751 Secondary polycythemia: Secondary | ICD-10-CM | POA: Diagnosis not present

## 2021-12-03 DIAGNOSIS — I700 Atherosclerosis of aorta: Secondary | ICD-10-CM | POA: Diagnosis not present

## 2021-12-04 ENCOUNTER — Other Ambulatory Visit: Payer: Self-pay | Admitting: Internal Medicine

## 2021-12-04 DIAGNOSIS — R634 Abnormal weight loss: Secondary | ICD-10-CM

## 2021-12-04 DIAGNOSIS — F17201 Nicotine dependence, unspecified, in remission: Secondary | ICD-10-CM

## 2021-12-07 DIAGNOSIS — M79604 Pain in right leg: Secondary | ICD-10-CM | POA: Diagnosis not present

## 2021-12-07 DIAGNOSIS — R2689 Other abnormalities of gait and mobility: Secondary | ICD-10-CM | POA: Diagnosis not present

## 2021-12-07 DIAGNOSIS — R202 Paresthesia of skin: Secondary | ICD-10-CM | POA: Diagnosis not present

## 2021-12-08 ENCOUNTER — Ambulatory Visit
Admission: RE | Admit: 2021-12-08 | Discharge: 2021-12-08 | Disposition: A | Discharge status: Home / Self Care | Payer: Medicare HMO | Source: Ambulatory Visit | Attending: Internal Medicine | Admitting: Internal Medicine

## 2021-12-08 DIAGNOSIS — R634 Abnormal weight loss: Secondary | ICD-10-CM

## 2021-12-08 DIAGNOSIS — R918 Other nonspecific abnormal finding of lung field: Secondary | ICD-10-CM | POA: Diagnosis not present

## 2021-12-08 DIAGNOSIS — R1110 Vomiting, unspecified: Secondary | ICD-10-CM | POA: Diagnosis not present

## 2021-12-08 DIAGNOSIS — F17201 Nicotine dependence, unspecified, in remission: Secondary | ICD-10-CM

## 2021-12-08 DIAGNOSIS — I700 Atherosclerosis of aorta: Secondary | ICD-10-CM | POA: Diagnosis not present

## 2021-12-08 MED ORDER — IOPAMIDOL (ISOVUE-300) INJECTION 61%
100.0000 mL | Freq: Once | INTRAVENOUS | Status: AC | PRN
  Administered 2021-12-08: 100 mL via INTRAVENOUS

## 2021-12-14 DIAGNOSIS — R2689 Other abnormalities of gait and mobility: Secondary | ICD-10-CM | POA: Diagnosis not present

## 2021-12-14 DIAGNOSIS — R202 Paresthesia of skin: Secondary | ICD-10-CM | POA: Diagnosis not present

## 2021-12-14 DIAGNOSIS — M79604 Pain in right leg: Secondary | ICD-10-CM | POA: Diagnosis not present

## 2021-12-17 DIAGNOSIS — R202 Paresthesia of skin: Secondary | ICD-10-CM | POA: Diagnosis not present

## 2021-12-17 DIAGNOSIS — M79604 Pain in right leg: Secondary | ICD-10-CM | POA: Diagnosis not present

## 2021-12-17 DIAGNOSIS — R2689 Other abnormalities of gait and mobility: Secondary | ICD-10-CM | POA: Diagnosis not present

## 2021-12-29 DIAGNOSIS — M79604 Pain in right leg: Secondary | ICD-10-CM | POA: Diagnosis not present

## 2021-12-29 DIAGNOSIS — R202 Paresthesia of skin: Secondary | ICD-10-CM | POA: Diagnosis not present

## 2021-12-29 DIAGNOSIS — R2689 Other abnormalities of gait and mobility: Secondary | ICD-10-CM | POA: Diagnosis not present

## 2022-01-06 DIAGNOSIS — B078 Other viral warts: Secondary | ICD-10-CM | POA: Diagnosis not present

## 2022-01-06 DIAGNOSIS — L821 Other seborrheic keratosis: Secondary | ICD-10-CM | POA: Diagnosis not present

## 2022-01-06 DIAGNOSIS — L304 Erythema intertrigo: Secondary | ICD-10-CM | POA: Diagnosis not present

## 2022-01-06 DIAGNOSIS — L570 Actinic keratosis: Secondary | ICD-10-CM | POA: Diagnosis not present

## 2022-02-01 DIAGNOSIS — G5701 Lesion of sciatic nerve, right lower limb: Secondary | ICD-10-CM | POA: Diagnosis not present

## 2022-02-18 DIAGNOSIS — Z79899 Other long term (current) drug therapy: Secondary | ICD-10-CM | POA: Diagnosis not present

## 2022-02-18 DIAGNOSIS — M0609 Rheumatoid arthritis without rheumatoid factor, multiple sites: Secondary | ICD-10-CM | POA: Diagnosis not present

## 2022-02-18 DIAGNOSIS — L409 Psoriasis, unspecified: Secondary | ICD-10-CM | POA: Diagnosis not present

## 2022-03-09 ENCOUNTER — Other Ambulatory Visit: Payer: Self-pay | Admitting: Internal Medicine

## 2022-03-09 DIAGNOSIS — Z1231 Encounter for screening mammogram for malignant neoplasm of breast: Secondary | ICD-10-CM

## 2022-03-11 ENCOUNTER — Ambulatory Visit
Admission: RE | Admit: 2022-03-11 | Discharge: 2022-03-11 | Disposition: A | Discharge status: Home / Self Care | Payer: Medicare HMO | Source: Ambulatory Visit | Attending: Internal Medicine | Admitting: Internal Medicine

## 2022-03-11 DIAGNOSIS — Z1231 Encounter for screening mammogram for malignant neoplasm of breast: Secondary | ICD-10-CM

## 2022-03-24 DIAGNOSIS — E039 Hypothyroidism, unspecified: Secondary | ICD-10-CM | POA: Diagnosis not present

## 2022-03-24 DIAGNOSIS — R69 Illness, unspecified: Secondary | ICD-10-CM | POA: Diagnosis not present

## 2022-03-24 DIAGNOSIS — D72829 Elevated white blood cell count, unspecified: Secondary | ICD-10-CM | POA: Diagnosis not present

## 2022-04-27 DIAGNOSIS — L570 Actinic keratosis: Secondary | ICD-10-CM | POA: Diagnosis not present

## 2022-04-27 DIAGNOSIS — K130 Diseases of lips: Secondary | ICD-10-CM | POA: Diagnosis not present

## 2022-04-27 DIAGNOSIS — C44529 Squamous cell carcinoma of skin of other part of trunk: Secondary | ICD-10-CM | POA: Diagnosis not present

## 2022-04-27 DIAGNOSIS — C4442 Squamous cell carcinoma of skin of scalp and neck: Secondary | ICD-10-CM | POA: Diagnosis not present

## 2022-05-18 DIAGNOSIS — M069 Rheumatoid arthritis, unspecified: Secondary | ICD-10-CM | POA: Diagnosis not present

## 2022-05-18 DIAGNOSIS — H6592 Unspecified nonsuppurative otitis media, left ear: Secondary | ICD-10-CM | POA: Diagnosis not present

## 2022-05-21 DIAGNOSIS — M0609 Rheumatoid arthritis without rheumatoid factor, multiple sites: Secondary | ICD-10-CM | POA: Diagnosis not present

## 2022-05-21 DIAGNOSIS — Z79899 Other long term (current) drug therapy: Secondary | ICD-10-CM | POA: Diagnosis not present

## 2022-05-29 DIAGNOSIS — Z23 Encounter for immunization: Secondary | ICD-10-CM | POA: Diagnosis not present

## 2022-05-31 DIAGNOSIS — H90A22 Sensorineural hearing loss, unilateral, left ear, with restricted hearing on the contralateral side: Secondary | ICD-10-CM | POA: Diagnosis not present

## 2022-06-09 DIAGNOSIS — H9190 Unspecified hearing loss, unspecified ear: Secondary | ICD-10-CM | POA: Diagnosis not present

## 2022-06-09 DIAGNOSIS — H9122 Sudden idiopathic hearing loss, left ear: Secondary | ICD-10-CM | POA: Diagnosis not present

## 2022-06-21 DIAGNOSIS — K219 Gastro-esophageal reflux disease without esophagitis: Secondary | ICD-10-CM | POA: Diagnosis not present

## 2022-06-21 DIAGNOSIS — H90A22 Sensorineural hearing loss, unilateral, left ear, with restricted hearing on the contralateral side: Secondary | ICD-10-CM | POA: Diagnosis not present

## 2022-06-21 DIAGNOSIS — J322 Chronic ethmoidal sinusitis: Secondary | ICD-10-CM | POA: Diagnosis not present

## 2022-06-21 DIAGNOSIS — H9122 Sudden idiopathic hearing loss, left ear: Secondary | ICD-10-CM | POA: Diagnosis not present

## 2022-08-02 DIAGNOSIS — Z85828 Personal history of other malignant neoplasm of skin: Secondary | ICD-10-CM | POA: Diagnosis not present

## 2022-08-02 DIAGNOSIS — L570 Actinic keratosis: Secondary | ICD-10-CM | POA: Diagnosis not present

## 2022-09-01 DIAGNOSIS — H5203 Hypermetropia, bilateral: Secondary | ICD-10-CM | POA: Diagnosis not present

## 2022-09-01 DIAGNOSIS — Z0100 Encounter for examination of eyes and vision without abnormal findings: Secondary | ICD-10-CM | POA: Diagnosis not present

## 2022-09-20 DIAGNOSIS — M0609 Rheumatoid arthritis without rheumatoid factor, multiple sites: Secondary | ICD-10-CM | POA: Diagnosis not present

## 2022-09-20 DIAGNOSIS — L409 Psoriasis, unspecified: Secondary | ICD-10-CM | POA: Diagnosis not present

## 2022-09-20 DIAGNOSIS — Z79899 Other long term (current) drug therapy: Secondary | ICD-10-CM | POA: Diagnosis not present

## 2022-11-30 DIAGNOSIS — E039 Hypothyroidism, unspecified: Secondary | ICD-10-CM | POA: Diagnosis not present

## 2022-11-30 DIAGNOSIS — E559 Vitamin D deficiency, unspecified: Secondary | ICD-10-CM | POA: Diagnosis not present

## 2022-11-30 DIAGNOSIS — R7989 Other specified abnormal findings of blood chemistry: Secondary | ICD-10-CM | POA: Diagnosis not present

## 2022-11-30 DIAGNOSIS — D72829 Elevated white blood cell count, unspecified: Secondary | ICD-10-CM | POA: Diagnosis not present

## 2022-12-07 DIAGNOSIS — Z1339 Encounter for screening examination for other mental health and behavioral disorders: Secondary | ICD-10-CM | POA: Diagnosis not present

## 2022-12-07 DIAGNOSIS — E063 Autoimmune thyroiditis: Secondary | ICD-10-CM | POA: Diagnosis not present

## 2022-12-07 DIAGNOSIS — R911 Solitary pulmonary nodule: Secondary | ICD-10-CM | POA: Diagnosis not present

## 2022-12-07 DIAGNOSIS — M810 Age-related osteoporosis without current pathological fracture: Secondary | ICD-10-CM | POA: Diagnosis not present

## 2022-12-07 DIAGNOSIS — F419 Anxiety disorder, unspecified: Secondary | ICD-10-CM | POA: Diagnosis not present

## 2022-12-07 DIAGNOSIS — Z1331 Encounter for screening for depression: Secondary | ICD-10-CM | POA: Diagnosis not present

## 2022-12-07 DIAGNOSIS — F330 Major depressive disorder, recurrent, mild: Secondary | ICD-10-CM | POA: Diagnosis not present

## 2022-12-07 DIAGNOSIS — M069 Rheumatoid arthritis, unspecified: Secondary | ICD-10-CM | POA: Diagnosis not present

## 2022-12-07 DIAGNOSIS — H9190 Unspecified hearing loss, unspecified ear: Secondary | ICD-10-CM | POA: Diagnosis not present

## 2022-12-07 DIAGNOSIS — I700 Atherosclerosis of aorta: Secondary | ICD-10-CM | POA: Diagnosis not present

## 2022-12-07 DIAGNOSIS — E039 Hypothyroidism, unspecified: Secondary | ICD-10-CM | POA: Diagnosis not present

## 2022-12-08 ENCOUNTER — Other Ambulatory Visit: Payer: Self-pay | Admitting: Internal Medicine

## 2022-12-08 DIAGNOSIS — Z0000 Encounter for general adult medical examination without abnormal findings: Secondary | ICD-10-CM

## 2022-12-08 DIAGNOSIS — J439 Emphysema, unspecified: Secondary | ICD-10-CM

## 2022-12-23 DIAGNOSIS — Z79899 Other long term (current) drug therapy: Secondary | ICD-10-CM | POA: Diagnosis not present

## 2022-12-23 DIAGNOSIS — M0609 Rheumatoid arthritis without rheumatoid factor, multiple sites: Secondary | ICD-10-CM | POA: Diagnosis not present

## 2022-12-27 ENCOUNTER — Ambulatory Visit
Admission: RE | Admit: 2022-12-27 | Discharge: 2022-12-27 | Disposition: A | Discharge status: Home / Self Care | Payer: Medicare HMO | Source: Ambulatory Visit | Attending: Internal Medicine | Admitting: Internal Medicine

## 2022-12-27 DIAGNOSIS — J439 Emphysema, unspecified: Secondary | ICD-10-CM

## 2022-12-27 DIAGNOSIS — Z122 Encounter for screening for malignant neoplasm of respiratory organs: Secondary | ICD-10-CM | POA: Diagnosis not present

## 2022-12-27 DIAGNOSIS — Z0000 Encounter for general adult medical examination without abnormal findings: Secondary | ICD-10-CM

## 2022-12-27 DIAGNOSIS — I700 Atherosclerosis of aorta: Secondary | ICD-10-CM | POA: Diagnosis not present

## 2023-02-07 DIAGNOSIS — L821 Other seborrheic keratosis: Secondary | ICD-10-CM | POA: Diagnosis not present

## 2023-02-07 DIAGNOSIS — Z85828 Personal history of other malignant neoplasm of skin: Secondary | ICD-10-CM | POA: Diagnosis not present

## 2023-02-07 DIAGNOSIS — L570 Actinic keratosis: Secondary | ICD-10-CM | POA: Diagnosis not present

## 2023-02-07 DIAGNOSIS — L80 Vitiligo: Secondary | ICD-10-CM | POA: Diagnosis not present

## 2023-03-21 DIAGNOSIS — Z79899 Other long term (current) drug therapy: Secondary | ICD-10-CM | POA: Diagnosis not present

## 2023-03-21 DIAGNOSIS — M0609 Rheumatoid arthritis without rheumatoid factor, multiple sites: Secondary | ICD-10-CM | POA: Diagnosis not present

## 2023-03-29 DIAGNOSIS — E039 Hypothyroidism, unspecified: Secondary | ICD-10-CM | POA: Diagnosis not present

## 2023-03-29 DIAGNOSIS — J439 Emphysema, unspecified: Secondary | ICD-10-CM | POA: Diagnosis not present

## 2023-03-29 DIAGNOSIS — R590 Localized enlarged lymph nodes: Secondary | ICD-10-CM | POA: Diagnosis not present

## 2023-03-29 DIAGNOSIS — R911 Solitary pulmonary nodule: Secondary | ICD-10-CM | POA: Diagnosis not present

## 2023-03-29 DIAGNOSIS — E063 Autoimmune thyroiditis: Secondary | ICD-10-CM | POA: Diagnosis not present

## 2023-03-31 ENCOUNTER — Other Ambulatory Visit: Payer: Self-pay | Admitting: Internal Medicine

## 2023-03-31 DIAGNOSIS — R590 Localized enlarged lymph nodes: Secondary | ICD-10-CM

## 2023-04-05 ENCOUNTER — Ambulatory Visit
Admission: RE | Admit: 2023-04-05 | Discharge: 2023-04-05 | Disposition: A | Discharge status: Home / Self Care | Payer: Medicare HMO | Source: Ambulatory Visit | Attending: Internal Medicine | Admitting: Internal Medicine

## 2023-04-05 DIAGNOSIS — R590 Localized enlarged lymph nodes: Secondary | ICD-10-CM

## 2023-04-11 ENCOUNTER — Other Ambulatory Visit: Payer: Self-pay | Admitting: Internal Medicine

## 2023-04-11 DIAGNOSIS — R591 Generalized enlarged lymph nodes: Secondary | ICD-10-CM

## 2023-04-28 ENCOUNTER — Ambulatory Visit
Admission: RE | Admit: 2023-04-28 | Discharge: 2023-04-28 | Disposition: A | Discharge status: Home / Self Care | Payer: Medicare HMO | Source: Ambulatory Visit | Attending: Internal Medicine | Admitting: Internal Medicine

## 2023-04-28 DIAGNOSIS — R591 Generalized enlarged lymph nodes: Secondary | ICD-10-CM

## 2023-04-28 MED ORDER — IOPAMIDOL (ISOVUE-300) INJECTION 61%
200.0000 mL | Freq: Once | INTRAVENOUS | Status: AC | PRN
  Administered 2023-04-28: 75 mL via INTRAVENOUS

## 2023-05-09 DIAGNOSIS — N6315 Unspecified lump in the right breast, overlapping quadrants: Secondary | ICD-10-CM | POA: Diagnosis not present

## 2023-05-23 DIAGNOSIS — L570 Actinic keratosis: Secondary | ICD-10-CM | POA: Diagnosis not present

## 2023-05-23 DIAGNOSIS — Z85828 Personal history of other malignant neoplasm of skin: Secondary | ICD-10-CM | POA: Diagnosis not present

## 2023-05-25 DIAGNOSIS — M7022 Olecranon bursitis, left elbow: Secondary | ICD-10-CM | POA: Diagnosis not present

## 2023-06-13 DIAGNOSIS — M719 Bursopathy, unspecified: Secondary | ICD-10-CM | POA: Diagnosis not present

## 2023-06-13 DIAGNOSIS — R591 Generalized enlarged lymph nodes: Secondary | ICD-10-CM | POA: Diagnosis not present

## 2023-06-13 DIAGNOSIS — R634 Abnormal weight loss: Secondary | ICD-10-CM | POA: Diagnosis not present

## 2023-06-13 DIAGNOSIS — E039 Hypothyroidism, unspecified: Secondary | ICD-10-CM | POA: Diagnosis not present

## 2023-06-13 DIAGNOSIS — L80 Vitiligo: Secondary | ICD-10-CM | POA: Diagnosis not present

## 2023-06-13 DIAGNOSIS — M069 Rheumatoid arthritis, unspecified: Secondary | ICD-10-CM | POA: Diagnosis not present

## 2023-07-11 DIAGNOSIS — M7022 Olecranon bursitis, left elbow: Secondary | ICD-10-CM | POA: Diagnosis not present

## 2023-07-21 DIAGNOSIS — Z79899 Other long term (current) drug therapy: Secondary | ICD-10-CM | POA: Diagnosis not present

## 2023-07-21 DIAGNOSIS — M0609 Rheumatoid arthritis without rheumatoid factor, multiple sites: Secondary | ICD-10-CM | POA: Diagnosis not present

## 2023-08-02 DIAGNOSIS — Z79899 Other long term (current) drug therapy: Secondary | ICD-10-CM | POA: Diagnosis not present

## 2023-08-02 DIAGNOSIS — M0609 Rheumatoid arthritis without rheumatoid factor, multiple sites: Secondary | ICD-10-CM | POA: Diagnosis not present

## 2023-08-09 DIAGNOSIS — Z1152 Encounter for screening for COVID-19: Secondary | ICD-10-CM | POA: Diagnosis not present

## 2023-08-09 DIAGNOSIS — B349 Viral infection, unspecified: Secondary | ICD-10-CM | POA: Diagnosis not present

## 2023-08-09 DIAGNOSIS — M069 Rheumatoid arthritis, unspecified: Secondary | ICD-10-CM | POA: Diagnosis not present

## 2023-08-09 DIAGNOSIS — R051 Acute cough: Secondary | ICD-10-CM | POA: Diagnosis not present

## 2023-08-09 DIAGNOSIS — R0981 Nasal congestion: Secondary | ICD-10-CM | POA: Diagnosis not present

## 2023-08-09 DIAGNOSIS — R5383 Other fatigue: Secondary | ICD-10-CM | POA: Diagnosis not present

## 2023-08-22 DIAGNOSIS — D1801 Hemangioma of skin and subcutaneous tissue: Secondary | ICD-10-CM | POA: Diagnosis not present

## 2023-08-22 DIAGNOSIS — L570 Actinic keratosis: Secondary | ICD-10-CM | POA: Diagnosis not present

## 2023-08-22 DIAGNOSIS — L821 Other seborrheic keratosis: Secondary | ICD-10-CM | POA: Diagnosis not present

## 2023-08-22 DIAGNOSIS — C44629 Squamous cell carcinoma of skin of left upper limb, including shoulder: Secondary | ICD-10-CM | POA: Diagnosis not present

## 2023-08-22 DIAGNOSIS — Z85828 Personal history of other malignant neoplasm of skin: Secondary | ICD-10-CM | POA: Diagnosis not present

## 2023-08-22 DIAGNOSIS — L80 Vitiligo: Secondary | ICD-10-CM | POA: Diagnosis not present

## 2023-08-22 DIAGNOSIS — D485 Neoplasm of uncertain behavior of skin: Secondary | ICD-10-CM | POA: Diagnosis not present

## 2023-09-07 DIAGNOSIS — H524 Presbyopia: Secondary | ICD-10-CM | POA: Diagnosis not present

## 2023-09-19 DIAGNOSIS — C44629 Squamous cell carcinoma of skin of left upper limb, including shoulder: Secondary | ICD-10-CM | POA: Diagnosis not present

## 2023-09-19 DIAGNOSIS — Z85828 Personal history of other malignant neoplasm of skin: Secondary | ICD-10-CM | POA: Diagnosis not present

## 2023-10-04 DIAGNOSIS — L409 Psoriasis, unspecified: Secondary | ICD-10-CM | POA: Diagnosis not present

## 2023-10-04 DIAGNOSIS — Z79899 Other long term (current) drug therapy: Secondary | ICD-10-CM | POA: Diagnosis not present

## 2023-10-04 DIAGNOSIS — M0609 Rheumatoid arthritis without rheumatoid factor, multiple sites: Secondary | ICD-10-CM | POA: Diagnosis not present

## 2023-10-18 ENCOUNTER — Other Ambulatory Visit (HOSPITAL_COMMUNITY): Payer: Self-pay

## 2023-10-18 MED ORDER — METHOTREXATE SODIUM 2.5 MG PO TABS
15.0000 mg | ORAL_TABLET | ORAL | 0 refills | Status: DC
  Filled 2023-10-18: qty 72, 84d supply, fill #0

## 2023-11-21 ENCOUNTER — Other Ambulatory Visit (HOSPITAL_COMMUNITY): Payer: Self-pay

## 2023-11-21 MED ORDER — LEVOTHYROXINE SODIUM 112 MCG PO TABS
112.0000 ug | ORAL_TABLET | Freq: Every day | ORAL | 0 refills | Status: AC
  Filled 2023-11-21: qty 90, 90d supply, fill #0

## 2023-11-23 ENCOUNTER — Other Ambulatory Visit (HOSPITAL_COMMUNITY): Payer: Self-pay

## 2023-11-23 MED ORDER — SERTRALINE HCL 100 MG PO TABS
150.0000 mg | ORAL_TABLET | Freq: Every day | ORAL | 3 refills | Status: DC
  Filled 2023-11-23: qty 135, 90d supply, fill #0
  Filled 2024-02-15: qty 135, 90d supply, fill #1
  Filled 2024-05-15: qty 135, 90d supply, fill #2

## 2023-11-24 ENCOUNTER — Other Ambulatory Visit (HOSPITAL_COMMUNITY): Payer: Self-pay

## 2023-11-30 DIAGNOSIS — E039 Hypothyroidism, unspecified: Secondary | ICD-10-CM | POA: Diagnosis not present

## 2023-12-01 DIAGNOSIS — M810 Age-related osteoporosis without current pathological fracture: Secondary | ICD-10-CM | POA: Diagnosis not present

## 2023-12-01 DIAGNOSIS — E039 Hypothyroidism, unspecified: Secondary | ICD-10-CM | POA: Diagnosis not present

## 2023-12-01 DIAGNOSIS — R7989 Other specified abnormal findings of blood chemistry: Secondary | ICD-10-CM | POA: Diagnosis not present

## 2023-12-01 DIAGNOSIS — E559 Vitamin D deficiency, unspecified: Secondary | ICD-10-CM | POA: Diagnosis not present

## 2023-12-01 DIAGNOSIS — Z79899 Other long term (current) drug therapy: Secondary | ICD-10-CM | POA: Diagnosis not present

## 2023-12-09 ENCOUNTER — Other Ambulatory Visit (HOSPITAL_COMMUNITY): Payer: Self-pay

## 2023-12-09 DIAGNOSIS — F419 Anxiety disorder, unspecified: Secondary | ICD-10-CM | POA: Diagnosis not present

## 2023-12-09 DIAGNOSIS — Z0000 Encounter for general adult medical examination without abnormal findings: Secondary | ICD-10-CM | POA: Diagnosis not present

## 2023-12-09 DIAGNOSIS — E039 Hypothyroidism, unspecified: Secondary | ICD-10-CM | POA: Diagnosis not present

## 2023-12-09 DIAGNOSIS — Z1331 Encounter for screening for depression: Secondary | ICD-10-CM | POA: Diagnosis not present

## 2023-12-09 DIAGNOSIS — Z1339 Encounter for screening examination for other mental health and behavioral disorders: Secondary | ICD-10-CM | POA: Diagnosis not present

## 2023-12-09 DIAGNOSIS — J439 Emphysema, unspecified: Secondary | ICD-10-CM | POA: Diagnosis not present

## 2023-12-09 DIAGNOSIS — M5416 Radiculopathy, lumbar region: Secondary | ICD-10-CM | POA: Diagnosis not present

## 2023-12-09 DIAGNOSIS — M810 Age-related osteoporosis without current pathological fracture: Secondary | ICD-10-CM | POA: Diagnosis not present

## 2023-12-09 DIAGNOSIS — F330 Major depressive disorder, recurrent, mild: Secondary | ICD-10-CM | POA: Diagnosis not present

## 2023-12-09 DIAGNOSIS — M069 Rheumatoid arthritis, unspecified: Secondary | ICD-10-CM | POA: Diagnosis not present

## 2023-12-09 DIAGNOSIS — I700 Atherosclerosis of aorta: Secondary | ICD-10-CM | POA: Diagnosis not present

## 2023-12-09 DIAGNOSIS — R911 Solitary pulmonary nodule: Secondary | ICD-10-CM | POA: Diagnosis not present

## 2023-12-09 MED ORDER — ALPRAZOLAM 0.5 MG PO TABS
0.5000 mg | ORAL_TABLET | Freq: Two times a day (BID) | ORAL | 1 refills | Status: AC | PRN
  Filled 2023-12-09: qty 30, 15d supply, fill #0
  Filled 2024-03-30: qty 30, 15d supply, fill #1

## 2023-12-10 ENCOUNTER — Other Ambulatory Visit (HOSPITAL_COMMUNITY): Payer: Self-pay

## 2023-12-12 ENCOUNTER — Other Ambulatory Visit (HOSPITAL_COMMUNITY): Payer: Self-pay

## 2023-12-13 ENCOUNTER — Other Ambulatory Visit: Payer: Self-pay | Admitting: Internal Medicine

## 2023-12-13 DIAGNOSIS — R911 Solitary pulmonary nodule: Secondary | ICD-10-CM

## 2023-12-22 ENCOUNTER — Other Ambulatory Visit

## 2023-12-28 ENCOUNTER — Ambulatory Visit
Admission: RE | Admit: 2023-12-28 | Discharge: 2023-12-28 | Disposition: A | Discharge status: Home / Self Care | Source: Ambulatory Visit | Attending: Internal Medicine | Admitting: Internal Medicine

## 2023-12-28 DIAGNOSIS — R911 Solitary pulmonary nodule: Secondary | ICD-10-CM

## 2023-12-28 DIAGNOSIS — Z122 Encounter for screening for malignant neoplasm of respiratory organs: Secondary | ICD-10-CM | POA: Diagnosis not present

## 2023-12-28 DIAGNOSIS — Z87891 Personal history of nicotine dependence: Secondary | ICD-10-CM | POA: Diagnosis not present

## 2024-01-02 DIAGNOSIS — Z79899 Other long term (current) drug therapy: Secondary | ICD-10-CM | POA: Diagnosis not present

## 2024-01-02 DIAGNOSIS — M0609 Rheumatoid arthritis without rheumatoid factor, multiple sites: Secondary | ICD-10-CM | POA: Diagnosis not present

## 2024-01-03 ENCOUNTER — Other Ambulatory Visit (HOSPITAL_COMMUNITY): Payer: Self-pay

## 2024-01-03 MED ORDER — METHOTREXATE SODIUM 2.5 MG PO TABS
15.0000 mg | ORAL_TABLET | ORAL | 0 refills | Status: DC
  Filled 2024-01-09: qty 72, 84d supply, fill #0

## 2024-01-05 ENCOUNTER — Other Ambulatory Visit (HOSPITAL_COMMUNITY): Payer: Self-pay

## 2024-01-10 ENCOUNTER — Other Ambulatory Visit (HOSPITAL_COMMUNITY): Payer: Self-pay

## 2024-02-14 DIAGNOSIS — D0461 Carcinoma in situ of skin of right upper limb, including shoulder: Secondary | ICD-10-CM | POA: Diagnosis not present

## 2024-02-14 DIAGNOSIS — L570 Actinic keratosis: Secondary | ICD-10-CM | POA: Diagnosis not present

## 2024-02-14 DIAGNOSIS — Z85828 Personal history of other malignant neoplasm of skin: Secondary | ICD-10-CM | POA: Diagnosis not present

## 2024-02-14 DIAGNOSIS — L80 Vitiligo: Secondary | ICD-10-CM | POA: Diagnosis not present

## 2024-02-14 DIAGNOSIS — D485 Neoplasm of uncertain behavior of skin: Secondary | ICD-10-CM | POA: Diagnosis not present

## 2024-02-14 DIAGNOSIS — L820 Inflamed seborrheic keratosis: Secondary | ICD-10-CM | POA: Diagnosis not present

## 2024-02-14 DIAGNOSIS — L821 Other seborrheic keratosis: Secondary | ICD-10-CM | POA: Diagnosis not present

## 2024-02-15 ENCOUNTER — Other Ambulatory Visit: Payer: Self-pay

## 2024-02-15 ENCOUNTER — Other Ambulatory Visit (HOSPITAL_COMMUNITY): Payer: Self-pay

## 2024-02-16 ENCOUNTER — Other Ambulatory Visit (HOSPITAL_COMMUNITY): Payer: Self-pay

## 2024-03-08 ENCOUNTER — Other Ambulatory Visit (HOSPITAL_COMMUNITY): Payer: Self-pay

## 2024-03-08 DIAGNOSIS — L570 Actinic keratosis: Secondary | ICD-10-CM | POA: Diagnosis not present

## 2024-03-08 DIAGNOSIS — Z85828 Personal history of other malignant neoplasm of skin: Secondary | ICD-10-CM | POA: Diagnosis not present

## 2024-03-08 DIAGNOSIS — L738 Other specified follicular disorders: Secondary | ICD-10-CM | POA: Diagnosis not present

## 2024-03-08 MED ORDER — DOXYCYCLINE HYCLATE 100 MG PO CAPS
100.0000 mg | ORAL_CAPSULE | Freq: Two times a day (BID) | ORAL | 0 refills | Status: AC
  Filled 2024-03-08: qty 20, 10d supply, fill #0

## 2024-03-26 ENCOUNTER — Other Ambulatory Visit (HOSPITAL_COMMUNITY): Payer: Self-pay

## 2024-03-26 ENCOUNTER — Other Ambulatory Visit (HOSPITAL_BASED_OUTPATIENT_CLINIC_OR_DEPARTMENT_OTHER): Payer: Self-pay

## 2024-03-26 MED ORDER — MUPIROCIN 2 % EX OINT
TOPICAL_OINTMENT | CUTANEOUS | 0 refills | Status: AC
  Filled 2024-03-26: qty 22, 10d supply, fill #0
  Filled 2024-03-27: qty 22, 30d supply, fill #0

## 2024-03-26 MED ORDER — MUPIROCIN 2 % EX OINT
TOPICAL_OINTMENT | CUTANEOUS | 1 refills | Status: AC
  Filled 2024-03-26: qty 22, 10d supply, fill #0
  Filled 2024-04-19: qty 22, 30d supply, fill #0
  Filled 2024-04-24: qty 22, 10d supply, fill #0

## 2024-03-27 ENCOUNTER — Other Ambulatory Visit: Payer: Self-pay

## 2024-03-27 ENCOUNTER — Other Ambulatory Visit (HOSPITAL_COMMUNITY): Payer: Self-pay

## 2024-03-27 MED ORDER — METHOTREXATE SODIUM 2.5 MG PO TABS
15.0000 mg | ORAL_TABLET | ORAL | 0 refills | Status: DC
  Filled 2024-03-27: qty 72, 84d supply, fill #0

## 2024-03-30 ENCOUNTER — Other Ambulatory Visit: Payer: Self-pay

## 2024-04-02 ENCOUNTER — Other Ambulatory Visit (HOSPITAL_COMMUNITY): Payer: Self-pay

## 2024-04-02 DIAGNOSIS — F419 Anxiety disorder, unspecified: Secondary | ICD-10-CM | POA: Diagnosis not present

## 2024-04-02 DIAGNOSIS — F330 Major depressive disorder, recurrent, mild: Secondary | ICD-10-CM | POA: Diagnosis not present

## 2024-04-02 DIAGNOSIS — E039 Hypothyroidism, unspecified: Secondary | ICD-10-CM | POA: Diagnosis not present

## 2024-04-02 DIAGNOSIS — F4321 Adjustment disorder with depressed mood: Secondary | ICD-10-CM | POA: Diagnosis not present

## 2024-04-02 MED ORDER — BUSPIRONE HCL 5 MG PO TABS
5.0000 mg | ORAL_TABLET | Freq: Two times a day (BID) | ORAL | 1 refills | Status: AC
  Filled 2024-04-02: qty 180, 90d supply, fill #0
  Filled 2024-06-24 – 2024-07-19 (×3): qty 180, 90d supply, fill #1

## 2024-04-03 DIAGNOSIS — Z79899 Other long term (current) drug therapy: Secondary | ICD-10-CM | POA: Diagnosis not present

## 2024-04-03 DIAGNOSIS — L409 Psoriasis, unspecified: Secondary | ICD-10-CM | POA: Diagnosis not present

## 2024-04-03 DIAGNOSIS — M0609 Rheumatoid arthritis without rheumatoid factor, multiple sites: Secondary | ICD-10-CM | POA: Diagnosis not present

## 2024-04-10 ENCOUNTER — Other Ambulatory Visit (HOSPITAL_COMMUNITY): Payer: Self-pay

## 2024-04-13 ENCOUNTER — Other Ambulatory Visit (HOSPITAL_COMMUNITY): Payer: Self-pay

## 2024-04-19 ENCOUNTER — Other Ambulatory Visit (HOSPITAL_COMMUNITY): Payer: Self-pay

## 2024-04-20 ENCOUNTER — Other Ambulatory Visit (HOSPITAL_COMMUNITY): Payer: Self-pay

## 2024-04-23 ENCOUNTER — Other Ambulatory Visit (HOSPITAL_COMMUNITY): Payer: Self-pay

## 2024-04-24 ENCOUNTER — Other Ambulatory Visit: Payer: Self-pay

## 2024-04-24 ENCOUNTER — Other Ambulatory Visit (HOSPITAL_COMMUNITY): Payer: Self-pay

## 2024-04-25 ENCOUNTER — Other Ambulatory Visit (HOSPITAL_COMMUNITY): Payer: Self-pay

## 2024-04-26 ENCOUNTER — Other Ambulatory Visit (HOSPITAL_COMMUNITY): Payer: Self-pay

## 2024-04-27 ENCOUNTER — Encounter (HOSPITAL_COMMUNITY): Payer: Self-pay

## 2024-04-27 ENCOUNTER — Other Ambulatory Visit (HOSPITAL_COMMUNITY): Payer: Self-pay

## 2024-05-02 ENCOUNTER — Other Ambulatory Visit: Payer: Self-pay | Admitting: Internal Medicine

## 2024-05-02 DIAGNOSIS — Z1231 Encounter for screening mammogram for malignant neoplasm of breast: Secondary | ICD-10-CM

## 2024-05-03 ENCOUNTER — Other Ambulatory Visit (HOSPITAL_COMMUNITY): Payer: Self-pay

## 2024-05-03 DIAGNOSIS — F419 Anxiety disorder, unspecified: Secondary | ICD-10-CM | POA: Diagnosis not present

## 2024-05-03 DIAGNOSIS — Z1331 Encounter for screening for depression: Secondary | ICD-10-CM | POA: Diagnosis not present

## 2024-05-03 DIAGNOSIS — F330 Major depressive disorder, recurrent, mild: Secondary | ICD-10-CM | POA: Diagnosis not present

## 2024-05-03 DIAGNOSIS — M810 Age-related osteoporosis without current pathological fracture: Secondary | ICD-10-CM | POA: Diagnosis not present

## 2024-05-03 DIAGNOSIS — M5431 Sciatica, right side: Secondary | ICD-10-CM | POA: Diagnosis not present

## 2024-05-03 DIAGNOSIS — F4321 Adjustment disorder with depressed mood: Secondary | ICD-10-CM | POA: Diagnosis not present

## 2024-05-03 MED ORDER — LIDOCAINE 4 % EX PTCH
1.0000 | MEDICATED_PATCH | CUTANEOUS | 3 refills | Status: AC
  Filled 2024-05-03: qty 30, 30d supply, fill #0
  Filled 2024-05-27: qty 30, 30d supply, fill #1

## 2024-05-04 ENCOUNTER — Other Ambulatory Visit (HOSPITAL_COMMUNITY): Payer: Self-pay

## 2024-05-04 MED ORDER — LIDOCAINE 5 % EX PTCH
1.0000 | MEDICATED_PATCH | Freq: Every day | CUTANEOUS | 3 refills | Status: AC
  Filled 2024-05-04 – 2024-05-08 (×2): qty 30, 30d supply, fill #0

## 2024-05-07 ENCOUNTER — Encounter (HOSPITAL_COMMUNITY): Payer: Self-pay

## 2024-05-07 ENCOUNTER — Other Ambulatory Visit (HOSPITAL_COMMUNITY): Payer: Self-pay

## 2024-05-08 ENCOUNTER — Other Ambulatory Visit (HOSPITAL_COMMUNITY): Payer: Self-pay

## 2024-05-09 ENCOUNTER — Other Ambulatory Visit (HOSPITAL_COMMUNITY): Payer: Self-pay

## 2024-05-11 ENCOUNTER — Other Ambulatory Visit (HOSPITAL_COMMUNITY): Payer: Self-pay

## 2024-05-15 ENCOUNTER — Other Ambulatory Visit (HOSPITAL_COMMUNITY): Payer: Self-pay

## 2024-05-15 MED ORDER — FLUZONE HIGH-DOSE 0.5 ML IM SUSY
0.5000 mL | PREFILLED_SYRINGE | Freq: Once | INTRAMUSCULAR | 0 refills | Status: AC
  Filled 2024-05-15: qty 0.5, 1d supply, fill #0

## 2024-05-15 MED ORDER — LEVOTHYROXINE SODIUM 112 MCG PO TABS
112.0000 ug | ORAL_TABLET | ORAL | 3 refills | Status: AC
  Filled 2024-05-15: qty 75, 100d supply, fill #0
  Filled 2024-08-16: qty 75, 100d supply, fill #1

## 2024-05-17 ENCOUNTER — Other Ambulatory Visit (HOSPITAL_COMMUNITY): Payer: Self-pay

## 2024-05-22 DIAGNOSIS — R269 Unspecified abnormalities of gait and mobility: Secondary | ICD-10-CM | POA: Diagnosis not present

## 2024-05-22 DIAGNOSIS — M79604 Pain in right leg: Secondary | ICD-10-CM | POA: Diagnosis not present

## 2024-05-24 ENCOUNTER — Ambulatory Visit
Admission: RE | Admit: 2024-05-24 | Discharge: 2024-05-24 | Disposition: A | Source: Ambulatory Visit | Attending: Internal Medicine | Admitting: Internal Medicine

## 2024-05-24 DIAGNOSIS — Z1231 Encounter for screening mammogram for malignant neoplasm of breast: Secondary | ICD-10-CM | POA: Diagnosis not present

## 2024-05-28 ENCOUNTER — Other Ambulatory Visit (HOSPITAL_COMMUNITY): Payer: Self-pay

## 2024-05-30 ENCOUNTER — Other Ambulatory Visit (HOSPITAL_COMMUNITY): Payer: Self-pay

## 2024-06-02 ENCOUNTER — Other Ambulatory Visit (HOSPITAL_COMMUNITY): Payer: Self-pay

## 2024-06-04 ENCOUNTER — Other Ambulatory Visit (HOSPITAL_COMMUNITY): Payer: Self-pay

## 2024-06-05 ENCOUNTER — Other Ambulatory Visit (HOSPITAL_COMMUNITY): Payer: Self-pay

## 2024-06-05 DIAGNOSIS — M5431 Sciatica, right side: Secondary | ICD-10-CM | POA: Diagnosis not present

## 2024-06-05 DIAGNOSIS — Z79899 Other long term (current) drug therapy: Secondary | ICD-10-CM | POA: Diagnosis not present

## 2024-06-06 DIAGNOSIS — E559 Vitamin D deficiency, unspecified: Secondary | ICD-10-CM | POA: Diagnosis not present

## 2024-06-06 DIAGNOSIS — M069 Rheumatoid arthritis, unspecified: Secondary | ICD-10-CM | POA: Diagnosis not present

## 2024-06-06 DIAGNOSIS — M5431 Sciatica, right side: Secondary | ICD-10-CM | POA: Diagnosis not present

## 2024-06-06 DIAGNOSIS — J439 Emphysema, unspecified: Secondary | ICD-10-CM | POA: Diagnosis not present

## 2024-06-06 DIAGNOSIS — R591 Generalized enlarged lymph nodes: Secondary | ICD-10-CM | POA: Diagnosis not present

## 2024-06-06 DIAGNOSIS — I700 Atherosclerosis of aorta: Secondary | ICD-10-CM | POA: Diagnosis not present

## 2024-06-06 DIAGNOSIS — R911 Solitary pulmonary nodule: Secondary | ICD-10-CM | POA: Diagnosis not present

## 2024-06-06 DIAGNOSIS — E063 Autoimmune thyroiditis: Secondary | ICD-10-CM | POA: Diagnosis not present

## 2024-06-06 DIAGNOSIS — F419 Anxiety disorder, unspecified: Secondary | ICD-10-CM | POA: Diagnosis not present

## 2024-06-06 DIAGNOSIS — E039 Hypothyroidism, unspecified: Secondary | ICD-10-CM | POA: Diagnosis not present

## 2024-06-06 DIAGNOSIS — F330 Major depressive disorder, recurrent, mild: Secondary | ICD-10-CM | POA: Diagnosis not present

## 2024-06-06 DIAGNOSIS — M810 Age-related osteoporosis without current pathological fracture: Secondary | ICD-10-CM | POA: Diagnosis not present

## 2024-06-08 DIAGNOSIS — M5431 Sciatica, right side: Secondary | ICD-10-CM | POA: Diagnosis not present

## 2024-06-09 ENCOUNTER — Other Ambulatory Visit (HOSPITAL_COMMUNITY): Payer: Self-pay

## 2024-06-12 DIAGNOSIS — M5431 Sciatica, right side: Secondary | ICD-10-CM | POA: Diagnosis not present

## 2024-06-13 ENCOUNTER — Other Ambulatory Visit (HOSPITAL_COMMUNITY): Payer: Self-pay

## 2024-06-13 MED ORDER — METHOTREXATE SODIUM 2.5 MG PO TABS
15.0000 mg | ORAL_TABLET | ORAL | 0 refills | Status: DC
  Filled 2024-06-13: qty 72, 84d supply, fill #0

## 2024-06-13 MED ORDER — COVID-19 MRNA VAC-TRIS(PFIZER) 30 MCG/0.3ML IM SUSY
0.3000 mL | PREFILLED_SYRINGE | Freq: Once | INTRAMUSCULAR | 0 refills | Status: AC
  Filled 2024-06-13: qty 0.3, 1d supply, fill #0

## 2024-06-14 ENCOUNTER — Other Ambulatory Visit (HOSPITAL_COMMUNITY): Payer: Self-pay

## 2024-06-19 DIAGNOSIS — Z85828 Personal history of other malignant neoplasm of skin: Secondary | ICD-10-CM | POA: Diagnosis not present

## 2024-06-19 DIAGNOSIS — D485 Neoplasm of uncertain behavior of skin: Secondary | ICD-10-CM | POA: Diagnosis not present

## 2024-06-19 DIAGNOSIS — D0462 Carcinoma in situ of skin of left upper limb, including shoulder: Secondary | ICD-10-CM | POA: Diagnosis not present

## 2024-06-19 DIAGNOSIS — L80 Vitiligo: Secondary | ICD-10-CM | POA: Diagnosis not present

## 2024-06-19 DIAGNOSIS — L570 Actinic keratosis: Secondary | ICD-10-CM | POA: Diagnosis not present

## 2024-06-21 ENCOUNTER — Other Ambulatory Visit (HOSPITAL_COMMUNITY): Payer: Self-pay

## 2024-06-23 ENCOUNTER — Other Ambulatory Visit (HOSPITAL_COMMUNITY): Payer: Self-pay

## 2024-06-26 DIAGNOSIS — M5431 Sciatica, right side: Secondary | ICD-10-CM | POA: Diagnosis not present

## 2024-06-28 ENCOUNTER — Other Ambulatory Visit (HOSPITAL_COMMUNITY): Payer: Self-pay

## 2024-07-02 DIAGNOSIS — M5431 Sciatica, right side: Secondary | ICD-10-CM | POA: Diagnosis not present

## 2024-07-07 ENCOUNTER — Other Ambulatory Visit (HOSPITAL_COMMUNITY): Payer: Self-pay

## 2024-07-09 DIAGNOSIS — M5431 Sciatica, right side: Secondary | ICD-10-CM | POA: Diagnosis not present

## 2024-07-18 ENCOUNTER — Other Ambulatory Visit (HOSPITAL_COMMUNITY): Payer: Self-pay

## 2024-07-21 ENCOUNTER — Other Ambulatory Visit (HOSPITAL_COMMUNITY): Payer: Self-pay

## 2024-07-23 ENCOUNTER — Other Ambulatory Visit (HOSPITAL_COMMUNITY): Payer: Self-pay

## 2024-07-23 MED ORDER — DOXYCYCLINE HYCLATE 100 MG PO CAPS
100.0000 mg | ORAL_CAPSULE | Freq: Two times a day (BID) | ORAL | 0 refills | Status: AC
  Filled 2024-07-23: qty 20, 10d supply, fill #0

## 2024-07-24 ENCOUNTER — Other Ambulatory Visit (HOSPITAL_COMMUNITY): Payer: Self-pay

## 2024-07-24 MED ORDER — SULFAMETHOXAZOLE-TRIMETHOPRIM 800-160 MG PO TABS
1.0000 | ORAL_TABLET | Freq: Two times a day (BID) | ORAL | 0 refills | Status: AC
  Filled 2024-07-24: qty 20, 10d supply, fill #0

## 2024-07-24 MED ORDER — ALPRAZOLAM 0.5 MG PO TABS
0.5000 mg | ORAL_TABLET | Freq: Two times a day (BID) | ORAL | 0 refills | Status: AC | PRN
  Filled 2024-07-24: qty 60, 30d supply, fill #0

## 2024-07-26 ENCOUNTER — Other Ambulatory Visit (HOSPITAL_COMMUNITY): Payer: Self-pay

## 2024-07-26 MED ORDER — CLOBETASOL PROPIONATE 0.05 % EX OINT
1.0000 | TOPICAL_OINTMENT | CUTANEOUS | 3 refills | Status: AC
  Filled 2024-07-26: qty 45, 30d supply, fill #0
  Filled 2024-08-18: qty 45, 30d supply, fill #1

## 2024-07-27 DIAGNOSIS — M5431 Sciatica, right side: Secondary | ICD-10-CM | POA: Diagnosis not present

## 2024-08-13 ENCOUNTER — Other Ambulatory Visit (HOSPITAL_COMMUNITY): Payer: Self-pay

## 2024-08-13 MED ORDER — SERTRALINE HCL 100 MG PO TABS
150.0000 mg | ORAL_TABLET | Freq: Every day | ORAL | 3 refills | Status: AC
  Filled 2024-08-13: qty 135, 90d supply, fill #0

## 2024-08-28 ENCOUNTER — Other Ambulatory Visit (HOSPITAL_COMMUNITY): Payer: Self-pay

## 2024-08-28 MED ORDER — IMIQUIMOD 5 % EX CREA
TOPICAL_CREAM | CUTANEOUS | 1 refills | Status: AC
  Filled 2024-08-28: qty 24, 24d supply, fill #0
  Filled 2024-09-15: qty 24, 24d supply, fill #1

## 2024-08-29 ENCOUNTER — Other Ambulatory Visit (HOSPITAL_COMMUNITY): Payer: Self-pay

## 2024-09-11 ENCOUNTER — Other Ambulatory Visit (HOSPITAL_COMMUNITY): Payer: Self-pay

## 2024-09-11 MED ORDER — METHOTREXATE SODIUM 2.5 MG PO TABS
15.0000 mg | ORAL_TABLET | ORAL | 0 refills | Status: AC
  Filled 2024-09-11: qty 72, 84d supply, fill #0

## 2024-09-14 ENCOUNTER — Other Ambulatory Visit (HOSPITAL_COMMUNITY): Payer: Self-pay
